# Patient Record
Sex: Female | Born: 1935 | Race: White | Hispanic: No | Marital: Married | Smoking: Former smoker
Health system: Southern US, Community
[De-identification: ages and names within clinical notes are randomized; demographics above are authoritative.]

## PROBLEM LIST (undated history)

## (undated) DIAGNOSIS — I2510 Atherosclerotic heart disease of native coronary artery without angina pectoris: Secondary | ICD-10-CM

## (undated) DIAGNOSIS — M1990 Unspecified osteoarthritis, unspecified site: Secondary | ICD-10-CM

## (undated) DIAGNOSIS — F419 Anxiety disorder, unspecified: Secondary | ICD-10-CM

## (undated) DIAGNOSIS — D649 Anemia, unspecified: Secondary | ICD-10-CM

## (undated) DIAGNOSIS — I10 Essential (primary) hypertension: Secondary | ICD-10-CM

## (undated) DIAGNOSIS — K219 Gastro-esophageal reflux disease without esophagitis: Secondary | ICD-10-CM

## (undated) DIAGNOSIS — F0390 Unspecified dementia without behavioral disturbance: Secondary | ICD-10-CM

---

## 1999-12-22 ENCOUNTER — Encounter: Payer: Self-pay | Admitting: Orthopaedic Surgery

## 1999-12-30 ENCOUNTER — Inpatient Hospital Stay (HOSPITAL_COMMUNITY): Admission: RE | Admit: 1999-12-30 | Discharge: 2000-01-03 | Payer: Self-pay | Admitting: Orthopaedic Surgery

## 2000-01-23 ENCOUNTER — Encounter: Admission: RE | Admit: 2000-01-23 | Discharge: 2000-03-09 | Payer: Self-pay | Admitting: Orthopaedic Surgery

## 2000-02-10 ENCOUNTER — Ambulatory Visit (HOSPITAL_COMMUNITY): Admission: RE | Admit: 2000-02-10 | Discharge: 2000-02-10 | Payer: Self-pay | Admitting: Orthopaedic Surgery

## 2001-05-11 ENCOUNTER — Encounter: Payer: Self-pay | Admitting: Family Medicine

## 2001-05-11 ENCOUNTER — Encounter: Admission: RE | Admit: 2001-05-11 | Discharge: 2001-05-11 | Payer: Self-pay | Admitting: Family Medicine

## 2001-06-21 ENCOUNTER — Encounter: Payer: Self-pay | Admitting: Family Medicine

## 2001-06-21 ENCOUNTER — Encounter: Admission: RE | Admit: 2001-06-21 | Discharge: 2001-06-21 | Payer: Self-pay | Admitting: Family Medicine

## 2002-07-13 ENCOUNTER — Encounter: Payer: Self-pay | Admitting: Family Medicine

## 2002-07-13 ENCOUNTER — Encounter: Admission: RE | Admit: 2002-07-13 | Discharge: 2002-07-13 | Payer: Self-pay | Admitting: Family Medicine

## 2004-07-15 ENCOUNTER — Encounter: Admission: RE | Admit: 2004-07-15 | Discharge: 2004-07-15 | Payer: Self-pay | Admitting: Family Medicine

## 2005-01-01 ENCOUNTER — Encounter: Admission: RE | Admit: 2005-01-01 | Discharge: 2005-01-14 | Payer: Self-pay | Admitting: Orthopaedic Surgery

## 2005-01-07 ENCOUNTER — Encounter: Admission: RE | Admit: 2005-01-07 | Discharge: 2005-01-07 | Payer: Self-pay | Admitting: Orthopaedic Surgery

## 2005-03-25 ENCOUNTER — Encounter: Admission: RE | Admit: 2005-03-25 | Discharge: 2005-03-25 | Payer: Self-pay | Admitting: Orthopaedic Surgery

## 2005-05-25 ENCOUNTER — Encounter: Admission: RE | Admit: 2005-05-25 | Discharge: 2005-05-25 | Payer: Self-pay | Admitting: Orthopaedic Surgery

## 2005-08-26 ENCOUNTER — Encounter: Admission: RE | Admit: 2005-08-26 | Discharge: 2005-08-26 | Payer: Self-pay | Admitting: Family Medicine

## 2006-07-01 ENCOUNTER — Encounter: Admission: RE | Admit: 2006-07-01 | Discharge: 2006-07-01 | Payer: Self-pay | Admitting: Family Medicine

## 2006-08-15 ENCOUNTER — Emergency Department (HOSPITAL_COMMUNITY): Admission: EM | Admit: 2006-08-15 | Discharge: 2006-08-15 | Payer: Self-pay | Admitting: Emergency Medicine

## 2007-05-17 ENCOUNTER — Emergency Department (HOSPITAL_COMMUNITY): Admission: EM | Admit: 2007-05-17 | Discharge: 2007-05-17 | Payer: Self-pay | Admitting: Emergency Medicine

## 2008-01-10 ENCOUNTER — Emergency Department (HOSPITAL_COMMUNITY): Admission: EM | Admit: 2008-01-10 | Discharge: 2008-01-10 | Payer: Self-pay | Admitting: Emergency Medicine

## 2008-02-20 ENCOUNTER — Ambulatory Visit (HOSPITAL_COMMUNITY): Admission: RE | Admit: 2008-02-20 | Discharge: 2008-02-20 | Payer: Self-pay | Admitting: Orthopaedic Surgery

## 2009-07-08 ENCOUNTER — Inpatient Hospital Stay (HOSPITAL_COMMUNITY): Admission: EM | Admit: 2009-07-08 | Discharge: 2009-07-11 | Payer: Self-pay | Admitting: Emergency Medicine

## 2009-07-09 ENCOUNTER — Encounter (INDEPENDENT_AMBULATORY_CARE_PROVIDER_SITE_OTHER): Payer: Self-pay | Admitting: Internal Medicine

## 2009-07-09 ENCOUNTER — Ambulatory Visit: Payer: Self-pay | Admitting: Vascular Surgery

## 2009-08-09 ENCOUNTER — Emergency Department (HOSPITAL_COMMUNITY): Admission: EM | Admit: 2009-08-09 | Discharge: 2009-08-09 | Payer: Self-pay | Admitting: Emergency Medicine

## 2010-01-07 ENCOUNTER — Observation Stay (HOSPITAL_COMMUNITY): Admission: EM | Admit: 2010-01-07 | Discharge: 2010-01-08 | Payer: Self-pay | Admitting: Emergency Medicine

## 2010-01-16 ENCOUNTER — Emergency Department (HOSPITAL_COMMUNITY): Admission: EM | Admit: 2010-01-16 | Discharge: 2010-01-16 | Payer: Self-pay | Admitting: Emergency Medicine

## 2010-04-03 ENCOUNTER — Emergency Department (HOSPITAL_COMMUNITY): Admission: EM | Admit: 2010-04-03 | Discharge: 2010-04-04 | Payer: Self-pay | Admitting: Emergency Medicine

## 2010-04-26 ENCOUNTER — Emergency Department (HOSPITAL_COMMUNITY): Admission: EM | Admit: 2010-04-26 | Discharge: 2010-04-27 | Payer: Self-pay | Admitting: Emergency Medicine

## 2010-07-13 ENCOUNTER — Encounter: Payer: Self-pay | Admitting: Orthopaedic Surgery

## 2010-07-14 ENCOUNTER — Encounter: Payer: Self-pay | Admitting: Orthopaedic Surgery

## 2010-07-22 ENCOUNTER — Observation Stay (HOSPITAL_COMMUNITY)
Admission: EM | Admit: 2010-07-22 | Discharge: 2010-07-24 | Disposition: A | Discharge status: Home / Self Care | Payer: Medicare Other | Attending: Internal Medicine | Admitting: Internal Medicine

## 2010-07-22 DIAGNOSIS — G9341 Metabolic encephalopathy: Secondary | ICD-10-CM | POA: Insufficient documentation

## 2010-07-22 DIAGNOSIS — M1990 Unspecified osteoarthritis, unspecified site: Secondary | ICD-10-CM | POA: Insufficient documentation

## 2010-07-22 DIAGNOSIS — F0280 Dementia in other diseases classified elsewhere without behavioral disturbance: Secondary | ICD-10-CM | POA: Insufficient documentation

## 2010-07-22 DIAGNOSIS — I10 Essential (primary) hypertension: Secondary | ICD-10-CM | POA: Insufficient documentation

## 2010-07-22 DIAGNOSIS — Z7982 Long term (current) use of aspirin: Secondary | ICD-10-CM | POA: Insufficient documentation

## 2010-07-22 DIAGNOSIS — K219 Gastro-esophageal reflux disease without esophagitis: Secondary | ICD-10-CM | POA: Insufficient documentation

## 2010-07-22 DIAGNOSIS — A498 Other bacterial infections of unspecified site: Secondary | ICD-10-CM | POA: Insufficient documentation

## 2010-07-22 DIAGNOSIS — E860 Dehydration: Secondary | ICD-10-CM | POA: Insufficient documentation

## 2010-07-22 DIAGNOSIS — N390 Urinary tract infection, site not specified: Principal | ICD-10-CM | POA: Insufficient documentation

## 2010-07-22 DIAGNOSIS — G309 Alzheimer's disease, unspecified: Secondary | ICD-10-CM | POA: Insufficient documentation

## 2010-07-22 DIAGNOSIS — Z96659 Presence of unspecified artificial knee joint: Secondary | ICD-10-CM | POA: Insufficient documentation

## 2010-07-22 DIAGNOSIS — R4182 Altered mental status, unspecified: Secondary | ICD-10-CM | POA: Insufficient documentation

## 2010-07-22 DIAGNOSIS — Z79899 Other long term (current) drug therapy: Secondary | ICD-10-CM | POA: Insufficient documentation

## 2010-07-22 DIAGNOSIS — F411 Generalized anxiety disorder: Secondary | ICD-10-CM | POA: Insufficient documentation

## 2010-07-22 LAB — DIFFERENTIAL
Basophils Absolute: 0 10*3/uL (ref 0.0–0.1)
Lymphocytes Relative: 7 % — ABNORMAL LOW (ref 12–46)
Lymphs Abs: 0.6 10*3/uL — ABNORMAL LOW (ref 0.7–4.0)
Monocytes Absolute: 0.6 10*3/uL (ref 0.1–1.0)
Neutro Abs: 7.4 10*3/uL (ref 1.7–7.7)

## 2010-07-22 LAB — CBC
HCT: 34.4 % — ABNORMAL LOW (ref 36.0–46.0)
Hemoglobin: 11.4 g/dL — ABNORMAL LOW (ref 12.0–15.0)
MCHC: 33.1 g/dL (ref 30.0–36.0)
MCV: 86.9 fL (ref 78.0–100.0)
WBC: 8.7 10*3/uL (ref 4.0–10.5)

## 2010-07-22 LAB — URINALYSIS, ROUTINE W REFLEX MICROSCOPIC
Bilirubin Urine: NEGATIVE
Hgb urine dipstick: NEGATIVE
Specific Gravity, Urine: 1.015 (ref 1.005–1.030)
Urine Glucose, Fasting: NEGATIVE mg/dL
Urobilinogen, UA: 0.2 mg/dL (ref 0.0–1.0)
pH: 6.5 (ref 5.0–8.0)

## 2010-07-22 LAB — COMPREHENSIVE METABOLIC PANEL
AST: 19 U/L (ref 0–37)
CO2: 28 mEq/L (ref 19–32)
Calcium: 9.6 mg/dL (ref 8.4–10.5)
Creatinine, Ser: 0.92 mg/dL (ref 0.4–1.2)
GFR calc Af Amer: >60 mL/min (ref >60)
GFR calc non Af Amer: 58 mL/min — ABNORMAL LOW (ref >60)
Sodium: 131 mEq/L — ABNORMAL LOW (ref 135–145)
Total Protein: 7.8 g/dL (ref 6.0–8.3)

## 2010-07-22 LAB — POCT CARDIAC MARKERS
CKMB, poc: 1.1 ng/mL (ref 1.0–8.0)
Myoglobin, poc: 128 ng/mL (ref 12–200)
Troponin i, poc: <0.05 ng/mL (ref 0.00–0.09)

## 2010-07-22 LAB — URINE MICROSCOPIC-ADD ON

## 2010-07-23 LAB — CBC
Hemoglobin: 10.5 g/dL — ABNORMAL LOW (ref 12.0–15.0)
MCH: 28.7 pg (ref 26.0–34.0)
MCV: 87.2 fL (ref 78.0–100.0)
RBC: 3.66 MIL/uL — ABNORMAL LOW (ref 3.87–5.11)

## 2010-07-23 LAB — BASIC METABOLIC PANEL
CO2: 29 mEq/L (ref 19–32)
Glucose, Bld: 87 mg/dL (ref 70–99)
Potassium: 4.1 mEq/L (ref 3.5–5.1)
Sodium: 136 mEq/L (ref 135–145)

## 2010-07-24 LAB — BASIC METABOLIC PANEL
CO2: 28 mEq/L (ref 19–32)
Calcium: 9.3 mg/dL (ref 8.4–10.5)
Creatinine, Ser: 0.88 mg/dL (ref 0.4–1.2)
GFR calc Af Amer: >60 mL/min (ref >60)
GFR calc non Af Amer: >60 mL/min (ref >60)

## 2010-07-24 LAB — URINE CULTURE: Colony Count: >=100000

## 2010-07-25 NOTE — H&P (Signed)
  NAME:  Tracy Bowers, Tracy Bowers NO.:  [id number]  MEDICAL RECORD NO.:  [id number]          PATIENT TYPE:  OBV  LOCATION:  1306                         FACILITY:  [facility]  PHYSICIAN:  Vania Rea, M.D. DATE OF BIRTH:  1925/05/31  DATE OF ADMISSION:  07/22/2010 DATE OF DISCHARGE:                             HISTORY & PHYSICAL   PRIMARY CARE PHYSICIAN:  Dr. Florentina Jenny and Dr. Velna Hatchet.  CHIEF COMPLAINT:  Altered mental status since this morning.  DICTATION ENDED HERE.     Vania Rea, M.D.     LC/MEDQ  D:  07/22/2010  T:  07/22/2010  Job:  [id number]  Electronically Signed by Vania Rea M.D. on 07/25/2010 11:14:58 PM

## 2010-07-25 NOTE — H&P (Signed)
NAME:  MELAH, EBLING NO.:  [id number]  MEDICAL RECORD NO.:  [id number]          PATIENT TYPE:  OBV  LOCATION:  0103                         FACILITY:  [facility]  PHYSICIAN:  Vania Rea, M.D. DATE OF BIRTH:  1925-04-13  DATE OF ADMISSION:  07/22/2010 DATE OF DISCHARGE:                             HISTORY & PHYSICAL   PRIMARY CARE PHYSICIAN: 1. Dr. Florentina Jenny. 2. Dr. Melba Coon.  CHIEF COMPLAINT:  Altered mental status since this morning.  HISTORY OF PRESENT ILLNESS:  This is an 77 year old Caucasian lady with a history of dementia, a resident of [facility] on the dementia unit, who is usually ambulatory and alert and has a very good appetite, who had an unwitnessed fall last night, was examined by the staff.  There was no evidence of laceration or bruises. She seemed to be her normal self and went to bed normally.  Patient, however, woke this morning more lethargic and more confused, not quite herself, not eating, which is a significant change from her baseline. Her family was contacted and she was brought to the emergency room for further evaluation.  There is no history of headache, nausea or vomiting.  There is no history of fever, cough or cold.  There is no history of increased frequency of urination or dysuria.  There is no history of passage of bloody or black stool.  PAST MEDICAL HISTORY: 1. Hypertension. 2. Alzheimer's disease. 3. Anxiety. 4. GERD. 5. Osteoarthritis. 6. She is status post left knee replacement.  MEDICATIONS: 1. Nexium 40 mg daily, to be started on omeprazole 20 mg daily once     Nexium is exhausted. 2. Xanax 0.5 mg 1/2 tablet three times daily. 3. Namenda 5 mg twice daily. 4. Exelon 6 mg daily. 5. Lisinopril HCTZ 20/25 one tablet Tuesdays, Thursdays, Saturdays. 6. Ambien 5 mg at bedtime when necessary. 7. Divalproex 125 mg twice daily. 8. Meloxicam 15 mg daily. 9. Vicodin 10 mg  daily. 10.Iron sulfate 1 tablet daily. 11.Aspirin 81 mg daily.  ALLERGIES:  No known drug allergies.  Patient is listed as being unable to tolerate 325 mg of ASPIRIN, but she takes the 81 mg without untoward effect. 1.  SOCIAL HISTORY:  She has not used tobacco or alcohol for over 30 years. No history of illicit drug use.  She is a resident of an assisted living facility as noted above.  She is still married.  Her husband is her health care power of attorney and he lives separately at home without family.  FAMILY HISTORY:  Significant for dementia in her mother and sister. There is no family history of cancers or mental illness.  REVIEW OF SYSTEMS:  Other than noted above, unremarkable.  PHYSICAL EXAMINATION:  GENERAL:  Pleasant elderly Caucasian lady, now reportedly much more alert than when she was first brought to the emergency room.  She has received about 200 mL of IV fluids. VITAL SIGNS:  Temperature is 97.9, pulse 73, respiration 18, blood pressure 160/85.  She is saturating 98% on 2 liters. HEENT:  Her pupils are round and equal.  Mucous membranes are pink. Anicteric.  No  cervical lymphadenopathy or thyromegaly.  No carotid bruits. CHEST:  Clear to auscultation bilaterally. CARDIOVASCULAR SYSTEM:  Regular rhythm.  She has a 2/6 systolic murmur. ABDOMEN:  Scaphoid, soft, nontender.  No masses. EXTREMITIES:  She has deformity of the left knee.  She is wearing a soft cast.  There is a small joint effusion.  The knee is not warm nor red. Has a trace edema of both lower extremities.  Dorsalis pedis 1+ bilaterally.  The toes are warm. CENTRAL NERVOUS SYSTEM:  Cranial nerves II-XII are grossly intact.  She had no focal lateralizing signs.  LABORATORY DATA:  Her white count is 8.7, hemoglobin 11.4, MCV 86.9, platelets 210.  She has 85% neutrophils, but her differential is otherwise unremarkable.  Her lipase is normal.  A complete metabolic panel shows a sodium low at 131,  potassium 4.2, chloride 95, CO2 of 28, glucose slightly elevated belong to 115, her BUN is 21, creatinine 0.92. Her calcium is 9.6, albumin is 3.8 and liver functions are otherwise unremarkable.  Her cardiac enzymes are completely normal with myoglobin of 128 and undetectable troponins.  Urinalysis shows specific gravity of 1.015, protein negative, nitrites positive, leukocyte esterase negative and urine microscopy shows many bacteria.  DIAGNOSTIC STUDIES:  Chest x-ray shows no acute cardiopulmonary abnormality and a CT scan of the brain shows no evidence of bleeding and no evidence of any other acute findings and is similar to the CT scan done in November 2011.  ASSESSMENT: 1. Altered mental status probably due to metabolic encephalopathy     related to urinary tract infection and dehydration. 2. Urinary tract infection. 3. Dehydration as evidenced by clinical exam and her BUN creatinine     ratio. 4. Hypertension. 5. Dementia without delirium. 6. Osteoarthritis. 7. Gastroesophageal reflux disease. 8. Anxiety.  PLAN: 1. We will bring this lady in for hydration.  We will get     an evaluation by physical therapy and occupational therapy.     Culture urine and empirically start Rocephin.  If patient is back     to her baseline by tomorrow, she can likely be discharged back to     the nursing facility for continued care, otherwise if patient does     not show improvement back to her baseline, consideration may be     given to transitioning to a skilled nursing facility. 2. Her code status was discussed with her husband and daughter-in-law     and they have confirmed that she is a limited code.  They do want     CPR, but wants no intubation. 3. Other plans as per orders.     Vania Rea, M.D.     LC/MEDQ  D:  07/22/2010  T:  07/22/2010  Job:  [id number]  cc:   Melba Coon, M.D.  Florentina Jenny, M.D.  Electronically Signed by Vania Rea M.D. on 07/25/2010  11:13:41 PM

## 2010-08-06 ENCOUNTER — Emergency Department (HOSPITAL_COMMUNITY)
Admission: EM | Admit: 2010-08-06 | Discharge: 2010-08-06 | Disposition: A | Discharge status: Home / Self Care | Payer: Medicare Other | Attending: Emergency Medicine | Admitting: Emergency Medicine

## 2010-08-06 ENCOUNTER — Encounter (HOSPITAL_COMMUNITY): Payer: Self-pay

## 2010-08-06 ENCOUNTER — Emergency Department (HOSPITAL_COMMUNITY): Payer: Medicare Other

## 2010-08-06 DIAGNOSIS — S0003XA Contusion of scalp, initial encounter: Secondary | ICD-10-CM | POA: Insufficient documentation

## 2010-08-06 DIAGNOSIS — F411 Generalized anxiety disorder: Secondary | ICD-10-CM | POA: Insufficient documentation

## 2010-08-06 DIAGNOSIS — I10 Essential (primary) hypertension: Secondary | ICD-10-CM | POA: Insufficient documentation

## 2010-08-06 DIAGNOSIS — Y9210 Unspecified residential institution as the place of occurrence of the external cause: Secondary | ICD-10-CM | POA: Insufficient documentation

## 2010-08-06 DIAGNOSIS — F068 Other specified mental disorders due to known physiological condition: Secondary | ICD-10-CM | POA: Insufficient documentation

## 2010-08-06 DIAGNOSIS — W19XXXA Unspecified fall, initial encounter: Secondary | ICD-10-CM | POA: Insufficient documentation

## 2010-08-06 DIAGNOSIS — S1093XA Contusion of unspecified part of neck, initial encounter: Secondary | ICD-10-CM | POA: Insufficient documentation

## 2010-08-06 DIAGNOSIS — R51 Headache: Secondary | ICD-10-CM | POA: Insufficient documentation

## 2010-08-06 NOTE — Discharge Summary (Signed)
NAME:  Tracy Bowers, STAPLES NO.:  [id number]  MEDICAL RECORD NO.:  [id number]          PATIENT TYPE:  OBV  LOCATION:  1306                         FACILITY:  [facility]  PHYSICIAN:  Zannie Cove, MD     DATE OF BIRTH:  [birth year]  DATE OF ADMISSION:  07/22/2010 DATE OF DISCHARGE:                         DISCHARGE SUMMARY-REFERRING   PRIMARY CARE PHYSICIAN: 1. Dr. Florentina Jenny. 2. Lupe Carney, MD  DISCHARGE DIAGNOSES: 1. E. coli urinary tract infection. 2. Metabolic encephalopathy/altered mental status secondary to urinary     tract infection, dehydration, and dementia, improved. 3. Alzheimer's dementia. 4. Hypertension. 5. Anxiety disorder. 6. Gastroesophageal reflux disease. 7. Osteoarthritis. 8. History of left total knee arthroplasty.  DISCHARGE MEDICATIONS: 1. Amlodipine 5 mg p.o. daily. 2. Ciprofloxacin 250 mg p.o. b.i.d. for 3 days. 3. Senokot 2 tablets p.o. daily p.r.n. for constipation. 4. Alprazolam 0.5 mg tablet, half a tablet p.o. 3 times a day. 5. Aspirin 81 mg daily. 6. Divalproex 125 mg p.o. b.i.d. 7. Ferrous sulfate 325 mg p.o. daily. 8. Hydrocolloid dressing, 1 application topically weekly as needed. 9. Hydrocortisone topical cream 1% b.i.d. as needed. 10.Lisinopril/hydrochlorothiazide 20/25, 1 tablet q. Tuesday,     Thursday, and Saturday. 11.Loratadine 10 mg p.o. daily. 12.Meloxicam 15 mg p.o. daily. 13.Namenda 5 mg p.o. b.i.d. 14.Neosporin topical, 1 application p.o. b.i.d. p.r.n. till wound     heals. 15.Omeprazole 20 mg p.o. daily. 16.Promethazine 25 mg p.o. q.4 h. p.r.n. for nausea, vomiting. 17.__________ 6 mg capsule daily. 18.Triamcinolone topical ointment 0.5% b.i.d. p.r.n. for rash. 19.Zolpidem 5 mg p.o. at bedtime.  DIAGNOSTIC INVESTIGATIONS: 1. Chest x-ray, January 31, no acute cardiopulmonary abnormalities,     stable biapical scarring. 2. CT of the head January 31, atrophy, small vessel disease.  No acute  intracranial findings.  HOSPITAL COURSE:  Tracy Bowers is an 77 year old female with history of Alzheimer's dementia and is at an assisted living facility presented to the hospital with increasing lethargy, increasing confusion to the emergency room.  On evaluation, she was found to be dehydrated as well as have urinary infection. 1. For the UTI, she was initially started on IV ceftriaxone with good     clinical response.  Urine cultures grew E. coli.  Final sensitivity     is currently still pending, is being discharged home back to the     facility on ciprofloxacin 250 mg p.o. b.i.d. for 3 more days for a     5-day course.  If her antibiotic sensitivities are different, I     will call the nursing home to change this 2. Dehydration.  This was evidenced by clinical exam as well as BUN     and creatinine ratio.  She was hydrated for close to 48 hours     gently.  She had good clinical response and overall skin turgor,     alertness, etc. 3. Dementia.  She did have an episode of delirium last night in the     hospital, most likely felt to be caused by sundowning.  This     responded to her usual p.r.n. doses of Ativan and subsequently has  remained stable mental status wise. 4. Rest of medical problems remained stable.  Discharge condition is     stable.  Vital Signs: Temperature is 98.4, blood pressure 160/62,     respirations 20, satting 97% on room air.  DISCHARGE LABS:  BMET:  Sodium 139, potassium 3.9, chloride 100, bicarb 28, BUN 19, creatinine 0.8, glucose 94.  DISCHARGE FOLLOWUP:  Dr. Florentina Jenny within 1 week of discharge.  I will defer to her primary physician the need to continue meloxicam.  I have not discontinued this since the patient has been maintained on meloxicam for a long time for osteoarthritis.  However, if her BUN and creatinine start to rise, this will need to be discontinued.     Zannie Cove, MD     PJ/MEDQ  D:  07/24/2010  T:  07/24/2010  Job:   [id number]  cc:   Florentina Jenny  L. Lupe Carney, M.D. Fax: [phone]  Electronically Signed by Zannie Cove  on 08/06/2010 02:12:32 PM

## 2010-09-02 LAB — URINALYSIS, ROUTINE W REFLEX MICROSCOPIC
Hgb urine dipstick: NEGATIVE
Nitrite: NEGATIVE
Specific Gravity, Urine: 1.011 (ref 1.005–1.030)
Urobilinogen, UA: 0.2 mg/dL (ref 0.0–1.0)

## 2010-09-02 LAB — DIFFERENTIAL
Basophils Absolute: 0 10*3/uL (ref 0.0–0.1)
Basophils Relative: 1 % (ref 0–1)
Monocytes Relative: 13 % — ABNORMAL HIGH (ref 3–12)
Neutro Abs: 2.6 10*3/uL (ref 1.7–7.7)
Neutrophils Relative %: 55 % (ref 43–77)

## 2010-09-02 LAB — CBC
Hemoglobin: 10.2 g/dL — ABNORMAL LOW (ref 12.0–15.0)
MCHC: 32.7 g/dL (ref 30.0–36.0)

## 2010-09-02 LAB — BASIC METABOLIC PANEL
Calcium: 8.9 mg/dL (ref 8.4–10.5)
GFR calc Af Amer: >60 mL/min (ref >60)
GFR calc non Af Amer: 52 mL/min — ABNORMAL LOW (ref >60)
Glucose, Bld: 136 mg/dL — ABNORMAL HIGH (ref 70–99)
Sodium: 134 mEq/L — ABNORMAL LOW (ref 135–145)

## 2010-09-06 LAB — DIFFERENTIAL
Basophils Absolute: 0 10*3/uL (ref 0.0–0.1)
Basophils Absolute: 0 10*3/uL (ref 0.0–0.1)
Basophils Relative: 1 % (ref 0–1)
Eosinophils Absolute: 0.6 10*3/uL (ref 0.0–0.7)
Eosinophils Relative: 2 % (ref 0–5)
Eosinophils Relative: 9 % — ABNORMAL HIGH (ref 0–5)
Lymphocytes Relative: 13 % (ref 12–46)
Monocytes Absolute: 0.5 10*3/uL (ref 0.1–1.0)
Monocytes Relative: 8 % (ref 3–12)

## 2010-09-06 LAB — POCT I-STAT, CHEM 8
Calcium, Ion: 0.93 mmol/L — ABNORMAL LOW (ref 1.12–1.32)
Glucose, Bld: 86 mg/dL (ref 70–99)
HCT: 34 % — ABNORMAL LOW (ref 36.0–46.0)
Hemoglobin: 11.6 g/dL — ABNORMAL LOW (ref 12.0–15.0)

## 2010-09-06 LAB — BASIC METABOLIC PANEL
CO2: 28 mEq/L (ref 19–32)
Calcium: 9.3 mg/dL (ref 8.4–10.5)
Creatinine, Ser: 1.03 mg/dL (ref 0.4–1.2)
Glucose, Bld: 88 mg/dL (ref 70–99)
Sodium: 133 mEq/L — ABNORMAL LOW (ref 135–145)

## 2010-09-06 LAB — TROPONIN I: Troponin I: 0.03 ng/mL (ref 0.00–0.06)

## 2010-09-06 LAB — CBC
MCH: 30 pg (ref 26.0–34.0)
MCV: 88.5 fL (ref 78.0–100.0)
Platelets: 273 10*3/uL (ref 150–400)
Platelets: 253 10*3/uL (ref 150–400)
RBC: 3.75 MIL/uL — ABNORMAL LOW (ref 3.87–5.11)
RDW: 12.9 % (ref 11.5–15.5)
WBC: 6.2 10*3/uL (ref 4.0–10.5)
WBC: 6.4 10*3/uL (ref 4.0–10.5)

## 2010-09-06 LAB — CK TOTAL AND CKMB (NOT AT ARMC): CK, MB: 1.7 ng/mL (ref 0.3–4.0)

## 2010-09-06 LAB — COMPREHENSIVE METABOLIC PANEL
AST: 20 U/L (ref 0–37)
Albumin: 3.9 g/dL (ref 3.5–5.2)
Alkaline Phosphatase: 89 U/L (ref 39–117)
Chloride: 96 mEq/L (ref 96–112)
GFR calc Af Amer: >60 mL/min (ref >60)
Potassium: 4.6 mEq/L (ref 3.5–5.1)
Sodium: 130 mEq/L — ABNORMAL LOW (ref 135–145)
Total Bilirubin: 0.8 mg/dL (ref 0.3–1.2)

## 2010-09-06 LAB — PROTIME-INR: Prothrombin Time: 11.9 seconds (ref 11.6–15.2)

## 2010-09-06 LAB — POCT CARDIAC MARKERS
CKMB, poc: 1.1 ng/mL (ref 1.0–8.0)
CKMB, poc: 1.4 ng/mL (ref 1.0–8.0)
Myoglobin, poc: 77.5 ng/mL (ref 12–200)

## 2010-09-06 LAB — CARDIAC PANEL(CRET KIN+CKTOT+MB+TROPI)
Relative Index: INVALID (ref 0.0–2.5)
Total CK: 52 U/L (ref 7–177)
Troponin I: 0.01 ng/mL (ref 0.00–0.06)

## 2010-09-08 LAB — LIPID PANEL
Cholesterol: 186 mg/dL (ref 0–200)
HDL: 70 mg/dL (ref >39)
LDL Cholesterol: 108 mg/dL — ABNORMAL HIGH (ref 0–99)
Total CHOL/HDL Ratio: 2.7 ratio
Triglycerides: 40 mg/dL (ref <150)
VLDL: 8 mg/dL (ref 0–40)

## 2010-09-08 LAB — URINE CULTURE: Colony Count: >=100000

## 2010-09-08 LAB — CBC
HCT: 29.2 % — ABNORMAL LOW (ref 36.0–46.0)
HCT: 30.7 % — ABNORMAL LOW (ref 36.0–46.0)
HCT: 36 % (ref 36.0–46.0)
Hemoglobin: 10 g/dL — ABNORMAL LOW (ref 12.0–15.0)
Hemoglobin: 10.5 g/dL — ABNORMAL LOW (ref 12.0–15.0)
Hemoglobin: 12.3 g/dL (ref 12.0–15.0)
MCHC: 34.3 g/dL (ref 30.0–36.0)
MCV: 88.1 fL (ref 78.0–100.0)
MCV: 88.7 fL (ref 78.0–100.0)
Platelets: 220 10*3/uL (ref 150–400)
Platelets: 239 10*3/uL (ref 150–400)
RBC: 4.03 MIL/uL (ref 3.87–5.11)
RDW: 13.4 % (ref 11.5–15.5)
WBC: 6 10*3/uL (ref 4.0–10.5)
WBC: 6.8 10*3/uL (ref 4.0–10.5)
WBC: 6.9 10*3/uL (ref 4.0–10.5)

## 2010-09-08 LAB — HEPATIC FUNCTION PANEL
AST: 16 U/L (ref 0–37)
Bilirubin, Direct: 0.2 mg/dL (ref 0.0–0.3)
Indirect Bilirubin: 0.7 mg/dL (ref 0.3–0.9)

## 2010-09-08 LAB — BASIC METABOLIC PANEL
BUN: 16 mg/dL (ref 6–23)
BUN: 16 mg/dL (ref 6–23)
Calcium: 8.7 mg/dL (ref 8.4–10.5)
Chloride: 98 mEq/L (ref 96–112)
Chloride: 98 mEq/L (ref 96–112)
Creatinine, Ser: 0.84 mg/dL (ref 0.4–1.2)
Creatinine, Ser: 0.99 mg/dL (ref 0.4–1.2)
GFR calc Af Amer: >60 mL/min (ref >60)
GFR calc non Af Amer: 50 mL/min — ABNORMAL LOW (ref >60)
GFR calc non Af Amer: 54 mL/min — ABNORMAL LOW (ref >60)
GFR calc non Af Amer: 59 mL/min — ABNORMAL LOW (ref >60)
Glucose, Bld: 86 mg/dL (ref 70–99)
Glucose, Bld: 99 mg/dL (ref 70–99)
Potassium: 3.9 mEq/L (ref 3.5–5.1)
Potassium: 3.9 mEq/L (ref 3.5–5.1)
Potassium: 4 mEq/L (ref 3.5–5.1)
Sodium: 130 mEq/L — ABNORMAL LOW (ref 135–145)
Sodium: 131 mEq/L — ABNORMAL LOW (ref 135–145)

## 2010-09-08 LAB — CK TOTAL AND CKMB (NOT AT ARMC)
CK, MB: 1.8 ng/mL (ref 0.3–4.0)
CK, MB: 2 ng/mL (ref 0.3–4.0)
Relative Index: INVALID (ref 0.0–2.5)
Total CK: 78 U/L (ref 7–177)

## 2010-09-08 LAB — TROPONIN I
Troponin I: 0.01 ng/mL (ref 0.00–0.06)
Troponin I: 0.01 ng/mL (ref 0.00–0.06)

## 2010-09-08 LAB — CARDIAC PANEL(CRET KIN+CKTOT+MB+TROPI)
CK, MB: 2 ng/mL (ref 0.3–4.0)
CK, MB: 2.1 ng/mL (ref 0.3–4.0)
Relative Index: INVALID (ref 0.0–2.5)
Troponin I: 0.01 ng/mL (ref 0.00–0.06)
Troponin I: 0.01 ng/mL (ref 0.00–0.06)

## 2010-09-08 LAB — DIFFERENTIAL
Basophils Absolute: 0 10*3/uL (ref 0.0–0.1)
Eosinophils Relative: 1 % (ref 0–5)
Lymphocytes Relative: 7 % — ABNORMAL LOW (ref 12–46)
Lymphs Abs: 0.6 10*3/uL — ABNORMAL LOW (ref 0.7–4.0)
Neutro Abs: 7.6 10*3/uL (ref 1.7–7.7)
Neutrophils Relative %: 86 % — ABNORMAL HIGH (ref 43–77)

## 2010-09-08 LAB — URINALYSIS, ROUTINE W REFLEX MICROSCOPIC
Nitrite: POSITIVE — AB
Specific Gravity, Urine: 1.012 (ref 1.005–1.030)
Urobilinogen, UA: 0.2 mg/dL (ref 0.0–1.0)
pH: 7 (ref 5.0–8.0)

## 2010-09-08 LAB — URINE MICROSCOPIC-ADD ON

## 2010-09-08 LAB — TSH: TSH: 2.669 u[IU]/mL (ref 0.350–4.500)

## 2010-09-08 LAB — D-DIMER, QUANTITATIVE: D-Dimer, Quant: 3.37 ug/mL-FEU — ABNORMAL HIGH (ref 0.00–0.48)

## 2010-11-07 NOTE — Op Note (Signed)
[place]. [facility]  Patient:    NOT GIVEN                             MRN: [id number] Attending:  Claude Manges. Cleophas Dunker, M.D.                           Operative Report  NO DICTATION. DD:  12/30/99 TD:  12/30/99 Job: [id number]

## 2010-11-07 NOTE — Op Note (Signed)
[place]. [facility]  Patient:    Tracy Bowers, Tracy Bowers                 MRN: [id number] Proc. Date: 12/30/99 Adm. Date:  [id number] Disc. Date: [id number] Attending:  Randolm Idol                           Operative Report  NOTE:  This report is dictated from memory.  PREOPERATIVE DIAGNOSIS:  End-stage osteoarthritis left knee.  POSTOPERATIVE DIAGNOSIS:  End-stage osteoarthritis left knee.  OPERATION:  Left total knee replacement.  SURGEON:  Claude Manges. Cleophas Dunker, M.D.  ASSISTANT:  Jamelle Rushing, P.A.C.  ANESTHESIA:  Spinal anesthesia.  COMPLICATIONS:  None.  COMPONENTS:  Depuy LCS standard femoral, standard plus rotating tibial platform with a 10 degree polyethylene bearing.  Cruciate metal back patella. All was secured with polymethyl methacrylate.  DESCRIPTION OF PROCEDURE:  With the patient comfortable on the operating table and under spinal anesthetic, the left lower extremity was placed in a thigh tourniquet.  The leg was then prepped with Betadine scrub and then DuraPrep from the tourniquet to the ankle. Sterile draping was performed.  With the extremity still elevated, it was Esmarch exanguinated with a proximal tourniquet at 350 mmHg.  Sterile draping.  Midline longitudinal incision was made and via sharp dissection, carried down through the subcutaneous tissue. The first layer of capsule was incised in the midline.  Using the Bovie, a medial parapatellar incision was then made through the deep capsule.  There were approximately 10 to 15 cc of clear yellow joint effusion.  The patella was everted 180 degrees and the knee flexed to 90 degrees. There were large osteophytes on the medial and lateral femoral condyle with considerable loss of articular cartilage in the medial and lateral femoral condyle.  I do not remember the specific pathology.  It has been two months since the surgery.  A TS Depuy system was utilized. The  appropriate jigs were used to make the femoral and tibial cuts.  The 10 mm flexion extension gap was utilized.  Both ACL and PCL were sacrificed. A 4 degree distal valgus cut was made.  ______ and MCL were intact.  The laminar spurs were inserted to remove remnants of soft tissue in the medial and lateral compartments including medial and lateral menisci and remnants of ACL and PCL stumps.  The ________ retractor was inserted behind the tibia.  It was advanced anteriorly so that we could visualize the entire surface.  A single spiked retractor was placed laterally to visualize the entire tibial surface.  We measured a standard plus component preoperatively and this was confirmed intraoperatively.  The standard femoral component was confirmed intraoperatively.  The appropriate cut was made in the tibia. The trial femoral and trial tibial components were then applied followed by a 10 mm bridging bearing.  The knee was placed through full range of motion and did not open with the dorsal valgus stress, had full extension and could flex perhaps 120 degrees or more.  There was no malrotation of the tibial component.  The patella was then prepared by removing enough bone to leave 13 mm of patella with a nice smooth surface.  Synovectomy was performed and extraneous fat pad was also resected.  The cruciate backed jig was then applied to make the appropriate cut in the patella to accept a cruciate and metal backed patella.  After the cuts were  made, the trial patella was applied. The knee was placed through full range of motion without malrotation of the patella.  Trial components were then removed.  The joint was then copiously irrigated with saline and antibiotic solution.  Each of the components  were then secured with polymethyl methacrylate.  A patella clamp was applied to the patella.  After complete maturation, the joint was inspected and extraneous methacrylate was removed with an  osteotome.  The knee was placed through full range of motion without malrotation of the tibial component or the patella.  The joint was again irrigated with saline solution.  There was no evidence of any extraneous methacrylate.  The deep capsule was closed with 0 Ti-Cron.  The superficial capsule closed with a running 0 Vicryl and subcutaneous with 2-0 Vicryl and the skin closed with skin clips.  Sterile bulky dressing was applied followed by an Ace bandage and a knee immobilizer.  The patient tolerated the procedure without complications. DD:  03/01/00 TD:  03/02/00 Job: [id number]

## 2010-11-07 NOTE — Discharge Summary (Signed)
[facility]  Patient:    Tracy Bowers, SWANGO                 MRN: [id number] Adm. Date:  [id number] Disc. Date: [id number] Attending:  Randolm Idol Dictator:   Arnoldo Morale, P.A.-C. CCAbran Cantor. Clovis Riley, MD, phone [phone]                           Discharge Summary  ADMITTING DIAGNOSES: 1. End-stage osteoarthritis left knee. 2. Hypercholesterolemia. 3. Hiatal hernia. 4. History of peptic ulcer disease.  DISCHARGE DIAGNOSES: 1. End-stage osteoarthritis left knee. 2. Hypercholesterolemia. 3. Hiatal hernia. 4. History of peptic ulcer disease. 5. Posthemorrhagic anemia.  SURGICAL PROCEDURE:  On December 30, 1999, Tracy Bowers underwent a left total knee arthroplasty by Dr. Claude Manges. [place].  COMPLICATIONS:  None.  CONSULTS: 1. Medical consult by [facility] hospitalist for [facility] done on    December 30, 1999. 2. Pharmacy consult for Coumadin therapy January 09, 2000. 3. Physical therapy and rehab medicine consult on December 31, 1999. 4. Occupational therapy consult on January 01, 2000.  HISTORY OF PRESENT ILLNESS:  This 77 year old white female presented to Dr. Cleophas Dunker with a three to four year history of progressively worsening sharp knee pain in her left knee.  She was treated conservatively initially and had significant improvement of the pain, but the pain has gradually returned and gotten more severe.  The pain is described as a burning, grinding type of pain which is constant.  She does have sharp, shooting pains with certain movements, and the knee does swell.  The knee pops and grinds with ambulation, and the pain does radiate up to her hip and down to her calf.  P.O. medicines have provided minimal relief, and she is currently using a cane to assist with ambulation.  Because of the severity of her symptoms, she is presenting for a left total knee arthroplasty.  HOSPITAL COURSE:  Tracy Bowers tolerated her surgical  procedure well without any immediate postoperative complications.  She was seen by [facility] hospitalist. She was started on Coumadin per protocol.  On postoperative day one, she was complaining of some nausea with the PCA, and her pain was well controlled with Percocet and Skelaxin.  T-max was 97.8.  Vitals were stable.  Left knee dressing was intact without drainage, and her leg was neurovascularly intact. Her hemoglobin was 10 with a hematocrit of 29.3, and she had received one unit of autologous blood postoperatively.  She was switched from a morphine PCA to Demerol and started on physical therapy per protocol.  Postoperative day two her nausea had resolved with the switch to Demerol.  She was discontinued off the PCA and started on p.o. medications per protocol. T-max was 99.  Vitals were stable.  Left knee incision was well approximated with staples.  Minimal drainage.  Leg was neurovascularly intact.  She did complain of some reflux symptoms and was started on Mylanta for treatment of that and that provided good relief.  Her hemoglobin was 10.1 with hematocrit of 29.2.  She continued to make good progress over the next several days and did not require any further transfusions.  On January 03, 2000, she was stable enough for discharge home.  Her T-max was 98.4.  Vital signs stable.  Left knee incision was unchanged.  She had complained of some decreased sensation in her left foot, but  this was grossly intact and seemed to be diminishing. She was believed ready for discharge home and was discharged home later in the day.  DISCHARGE INSTRUCTIONS: 1. She is to resume all prehospitalization medications and diet with the    exception of the cimetidine. 2. She was given OxyContin 10 mg p.o. q.12h., OxyIR 1-2 p.o. q.4h. p.r.n.    pain.  Coumadin one tablet p.o. q.d. with the dose to be determined by    pharmacy per protocol.  Flexeril 10 mg p.o. q.8.h. p.r.n. spasms, and    Protonix 40 mg p.o.  q day while she was on the Coumadin.  She was also    instructed to take ferrous sulfate 325 mg one tablet p.o. t.i.d. with food. 3. She is to be partial weightbearing 50% or less on the left leg with the use    of the walker. 4. She is to have home health physical therapy and R.N. as previously    arranged. 5. She is to follow-up with Dr. Cleophas Dunker in our office in seven to ten days    and is to call [phone] for that appointment.  She will have staples    removed at that time. 6. She is to notify Dr. Cleophas Dunker of a temperature greater than 101.5 degrees    Fahrenheit, chills, pain unrelieved by medications, or foul smelling    drainage from the wound.  She stated good understanding of these    instructions and was subsequently discharged home.  LABORATORY DATA:  On December 22, 1999, her hemoglobin was 10.8 with hematocrit of 31.6.  On July 11, hemoglobin was 10 with a hematocrit of 29.3.  On July 12, her hemoglobin was 10.1 with a hematocrit of 29.2, and on July 13, her hemoglobin was 9.5 with hematocrit of 28.8.  On July 2, her PT was 12.9 seconds with an INR of 1 and a PTT of 25, and on July 14, her PT was 16.7 seconds with an INR of 1.6.  On December 30, 1999, her iron was 55 mcg/dL.  TIBC was 304.  Percent saturation of 18, B12 of 425, RBC folate of 530, and ferritin of 44.  All other laboratory studies were within normal limits.  Chest x-ray done on December 22, 1999, showed chronic obstructive pulmonary disease with a probable pleural plaque in the right upper lobe, although comparison with previous x-rays would be suggested.  If none are present, then a limited CT was suggested. DD:  02/05/00 TD:  02/06/00 Job: [id number]

## 2011-03-20 LAB — URINALYSIS, ROUTINE W REFLEX MICROSCOPIC
Bilirubin Urine: NEGATIVE
Hgb urine dipstick: NEGATIVE
Specific Gravity, Urine: 1.012
pH: 6

## 2011-03-20 LAB — BASIC METABOLIC PANEL
CO2: 27
GFR calc non Af Amer: 39 — ABNORMAL LOW
Glucose, Bld: 134 — ABNORMAL HIGH
Potassium: 4.2
Sodium: 131 — ABNORMAL LOW

## 2011-03-20 LAB — DIFFERENTIAL
Basophils Absolute: 0
Eosinophils Relative: 0
Lymphocytes Relative: 9 — ABNORMAL LOW
Monocytes Absolute: 0.3

## 2011-03-20 LAB — CBC
HCT: 34 — ABNORMAL LOW
Hemoglobin: 11.5 — ABNORMAL LOW
MCHC: 33.9
RDW: 13.1

## 2011-03-31 LAB — I-STAT 8, (EC8 V) (CONVERTED LAB)
Acid-Base Excess: 1
Bicarbonate: 25.7 — ABNORMAL HIGH
HCT: 35 — ABNORMAL LOW
Operator id: 284251
TCO2: 27
pCO2, Ven: 41 — ABNORMAL LOW

## 2011-03-31 LAB — DIFFERENTIAL
Basophils Absolute: 0
Basophils Relative: 0
Eosinophils Absolute: 0 — ABNORMAL LOW
Eosinophils Relative: 0
Lymphocytes Relative: 13

## 2011-03-31 LAB — CBC
HCT: 31.7 — ABNORMAL LOW
MCHC: 33.7
MCV: 82.3
Platelets: 287
RDW: 14.3

## 2011-05-01 IMAGING — CR DG SHOULDER 2+V*R*
4 series · 4 of 4 positions shown · non-contrast
Comparison: None.

CLINICAL DATA: Fell and injured right shoulder.

RIGHT SHOULDER - 2+ VIEW 04/26/2010:

[t shoulder ap internal righ]
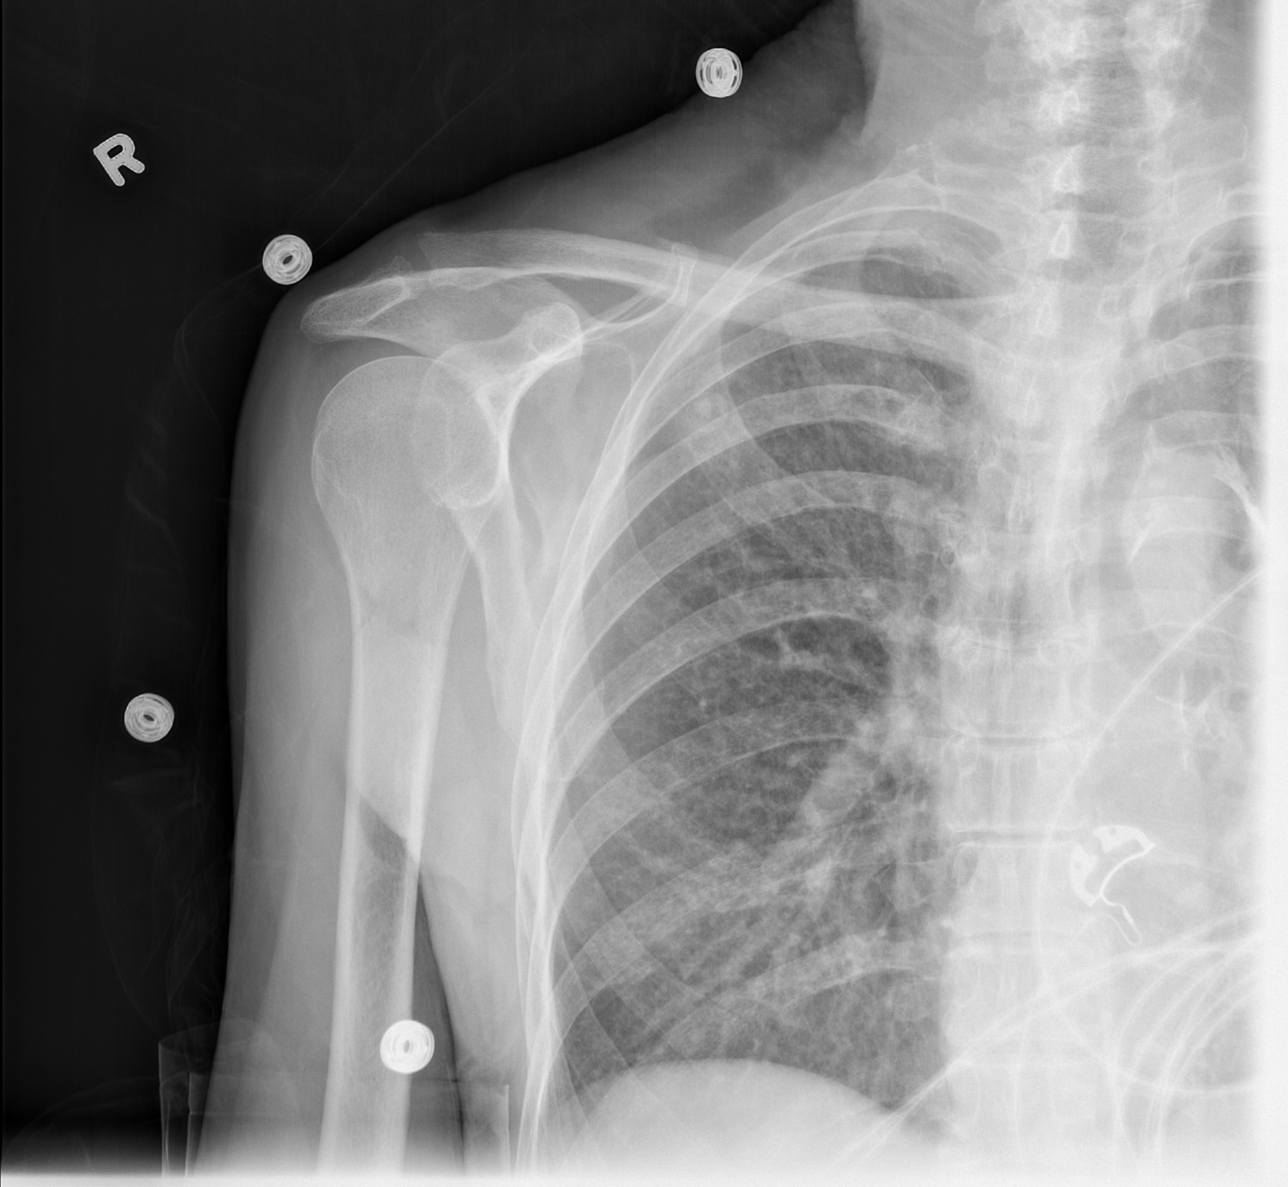

[t shoulder ap external righ]
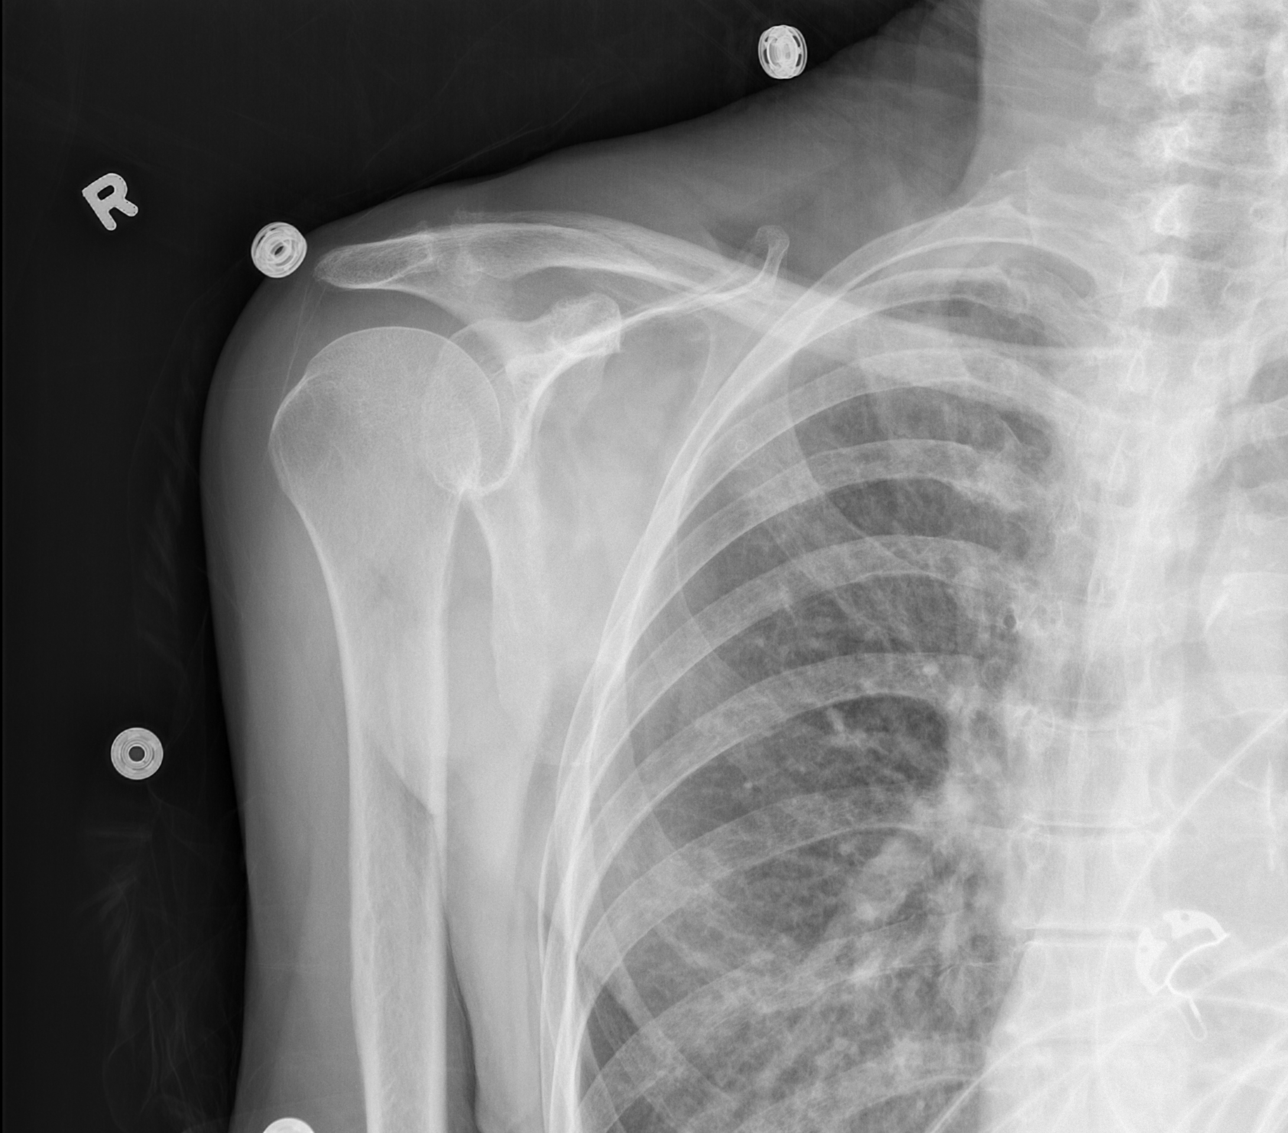

[t shoulder y view right (1 of 2)]
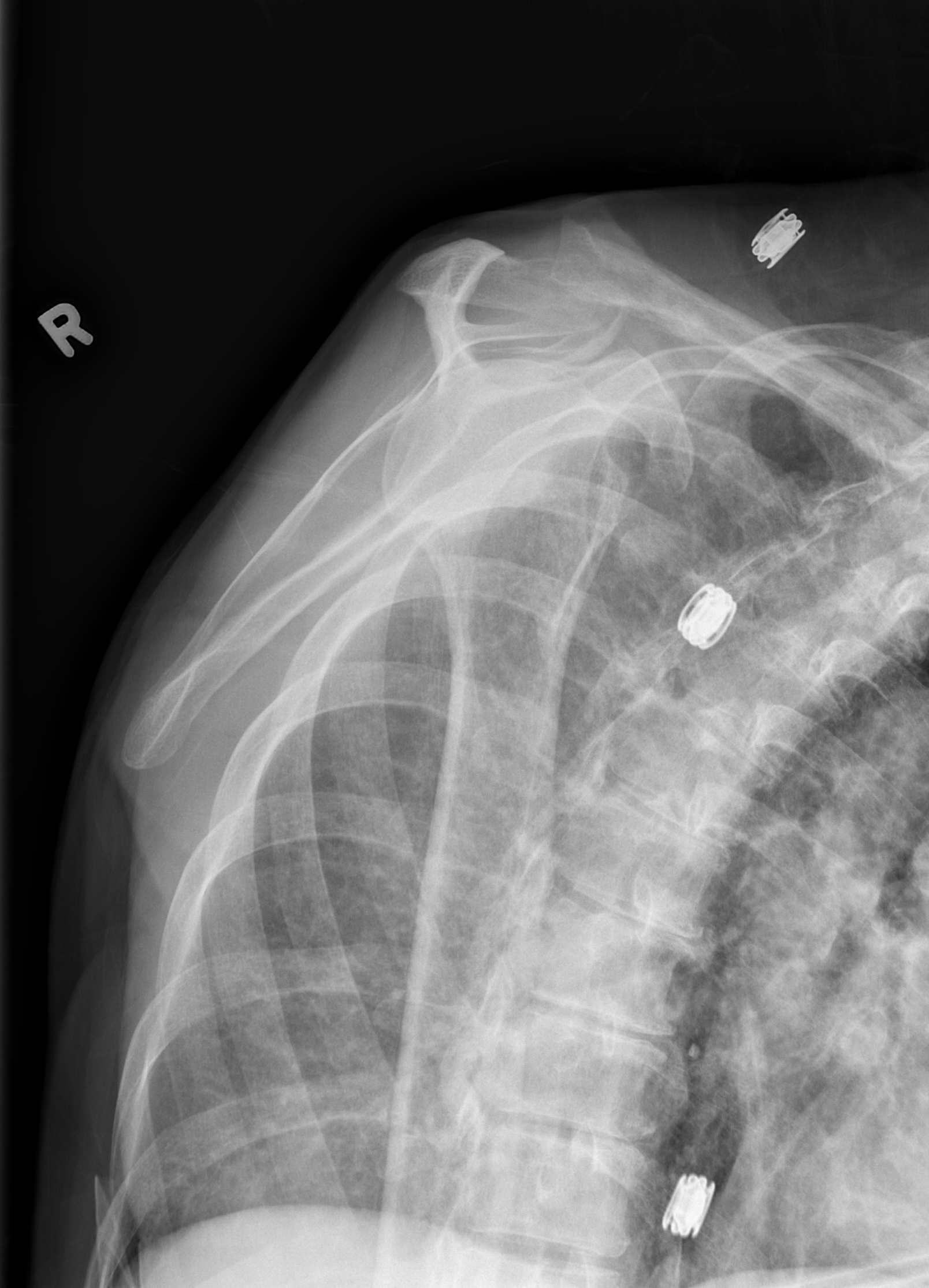

[t shoulder y view right (2 of 2)]
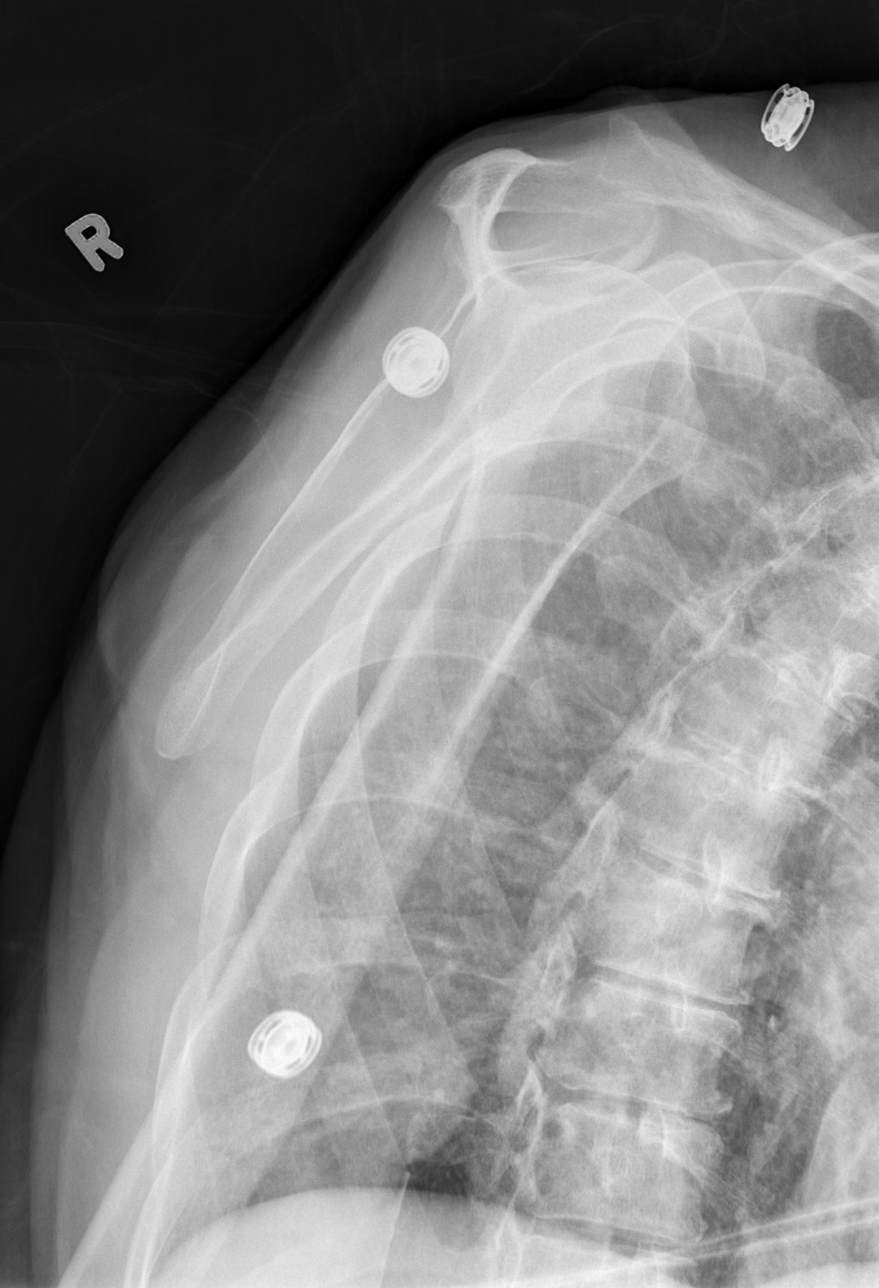

[4 of 4 positions shown; findings below may reference images not displayed]

FINDINGS: No evidence of acute fracture or glenohumeral
dislocation.  Subacromial space well preserved.  Acromioclavicular
joint intact with mild degenerative changes.  Mild generalized
osteopenia.
IMPRESSION: No acute osseous abnormality.  Mild degenerative changes involving
the AC joint.

## 2011-05-01 IMAGING — CT CT HEAD W/O CM
1 of 2 series · 16 of 30 positions shown, 20 images · non-contrast
Comparison: 04/03/2010

CLINICAL DATA: Fall, right frontal head trauma with abrasion and
bruising

CT HEAD WITHOUT CONTRAST
TECHNIQUE: Contiguous axial images were obtained from the base of
the skull through the vertex without contrast.

[Series 3: recon 2: brain · axial · 0.47mm/px · z∈[+132,+264]mm · 16 of 56 slices shown, 20 images]
[im 3/56  brain]
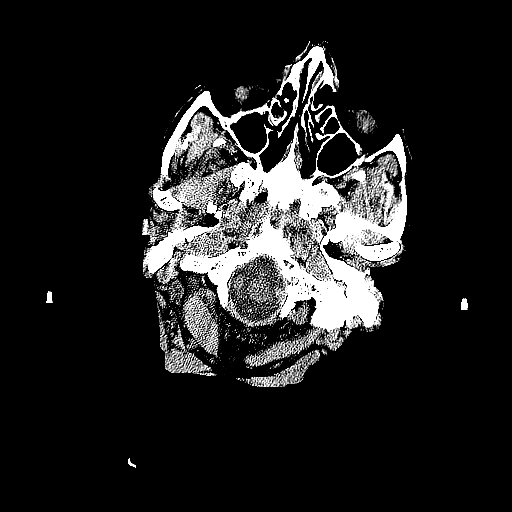
[im 3/56  bone]
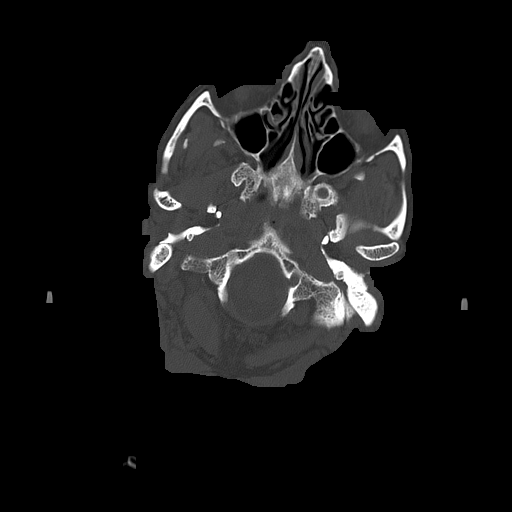
[im 6/56  brain]
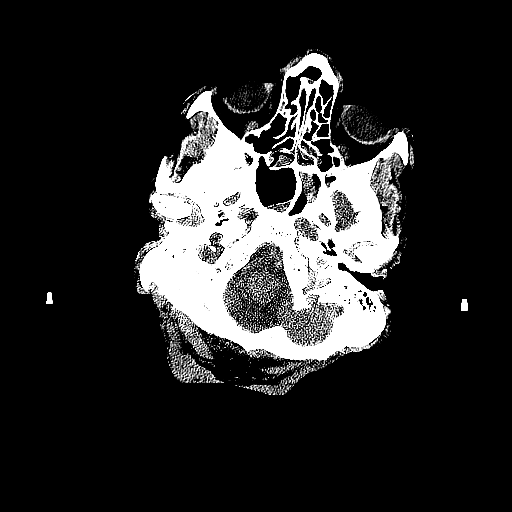
[im 9/56  brain]
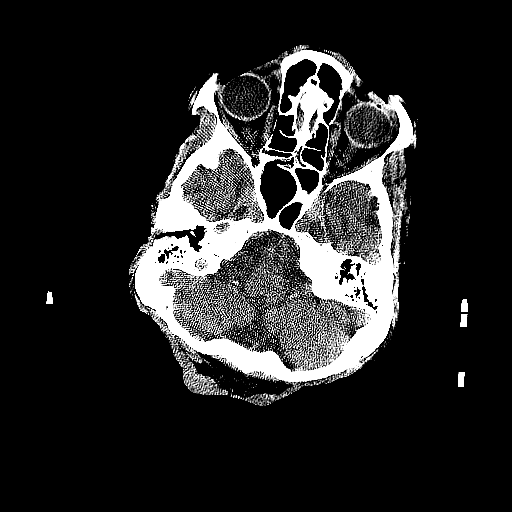
[im 12/56  brain]
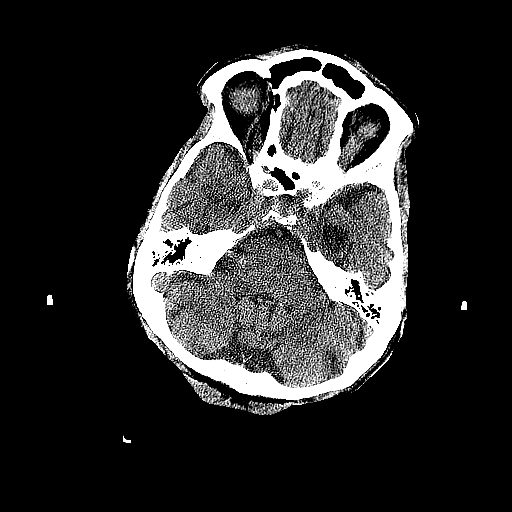
[im 18/56  brain]
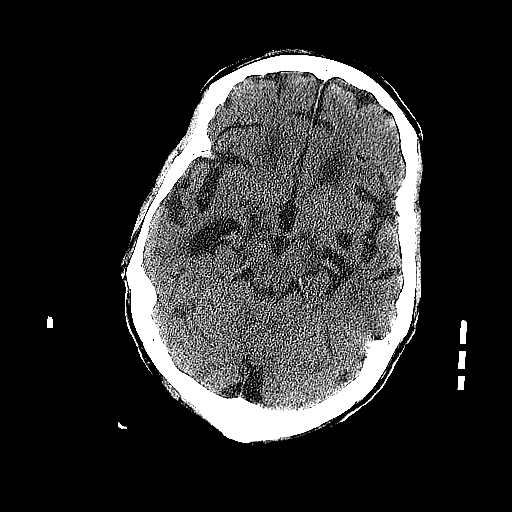
[im 18/56  bone]
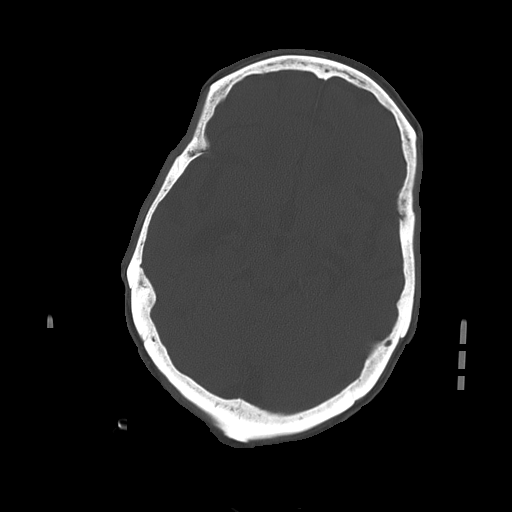
[im 21/56  brain]
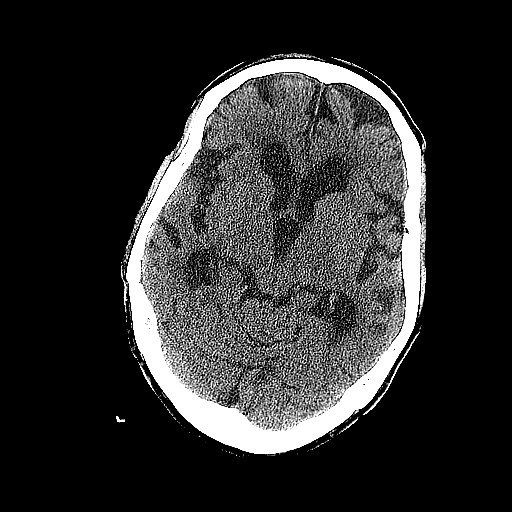
[im 24/56  brain]
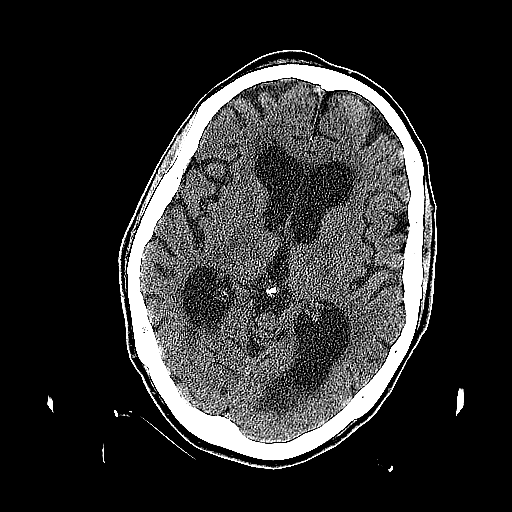
[im 27/56  brain]
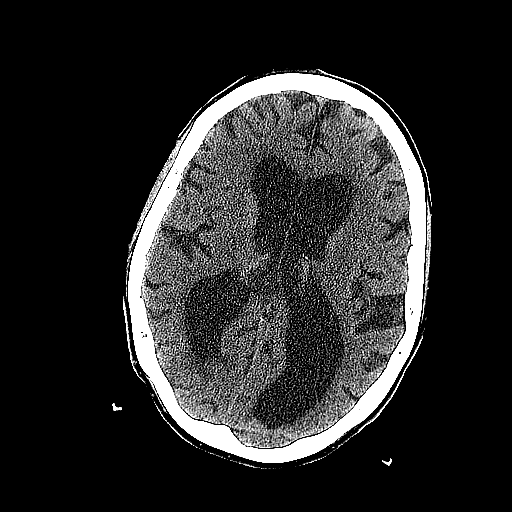
[im 29/56  brain]
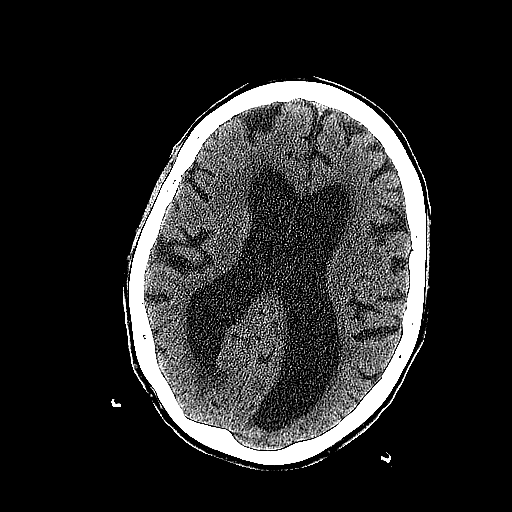
[im 29/56  bone]
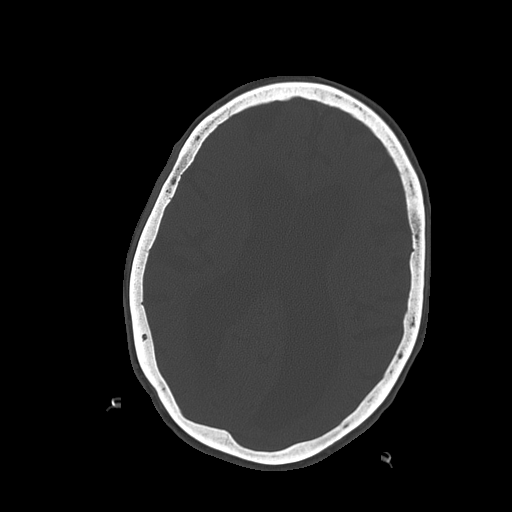
[im 32/56  brain]
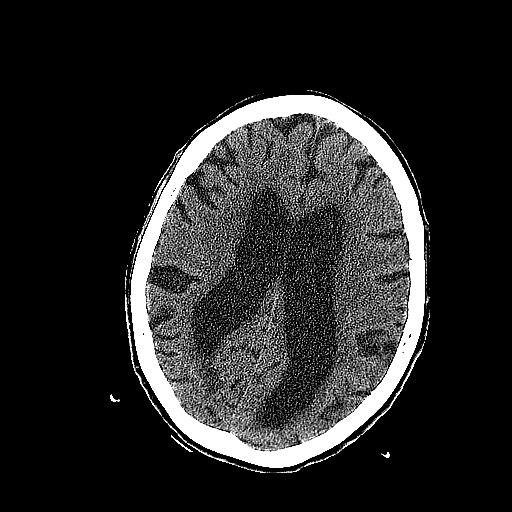
[im 35/56  brain]
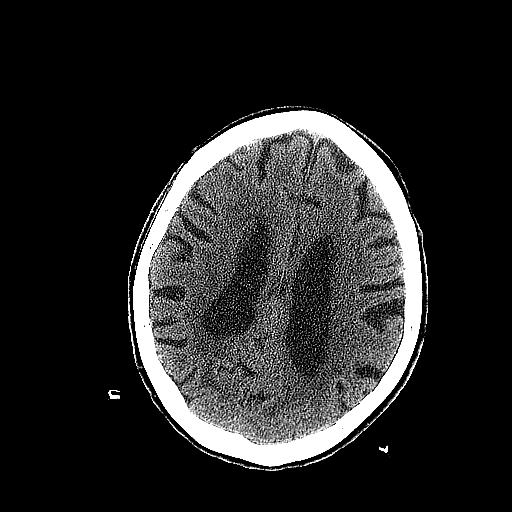
[im 38/56  brain]
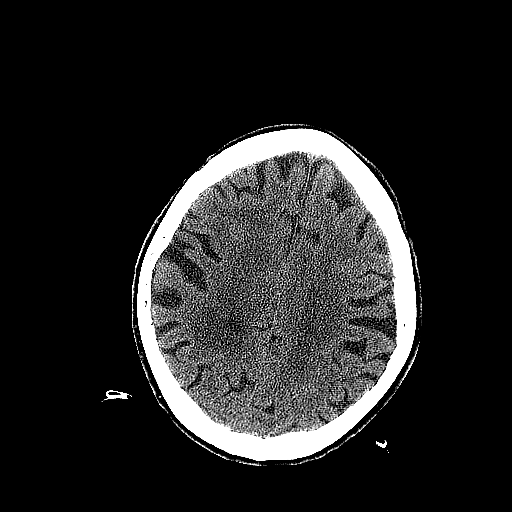
[im 44/56  brain]
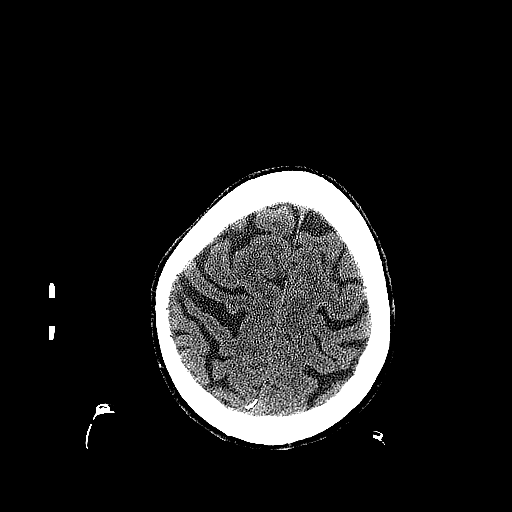
[im 44/56  bone]
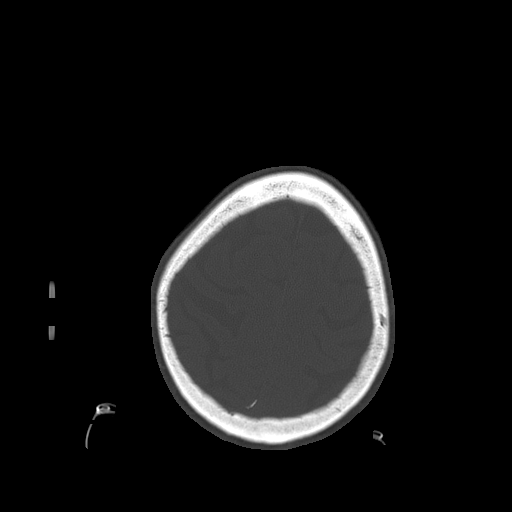
[im 47/56  brain]
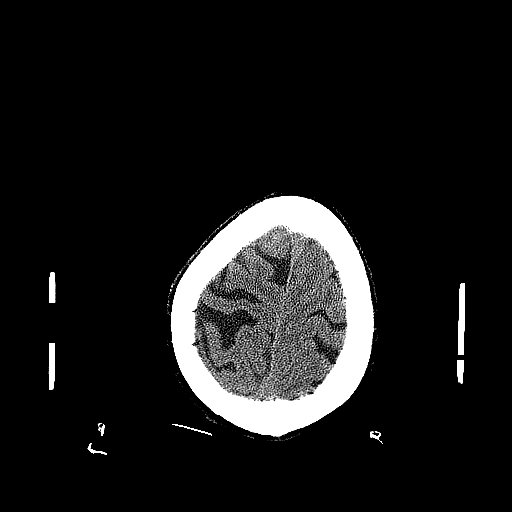
[im 50/56  brain]
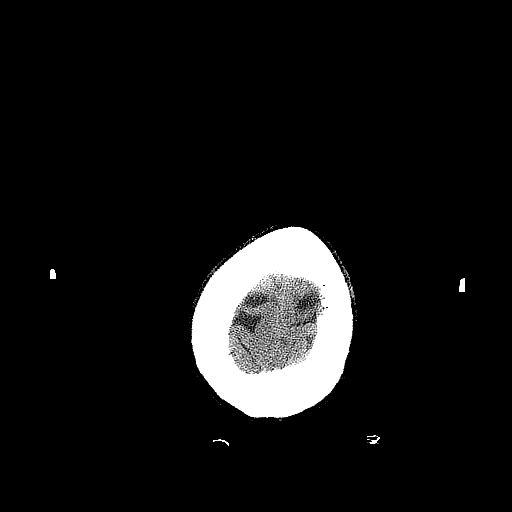
[im 53/56  brain]
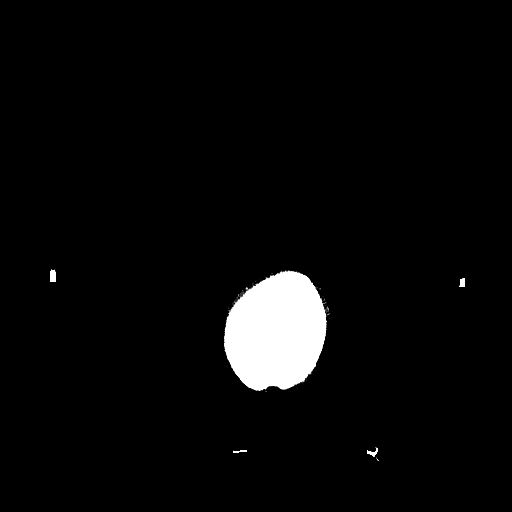

[16 of 30 positions shown; findings below may reference images not displayed]

FINDINGS: Stable degree of diffuse cortical volume loss with
ventricular prominence.  Periventricular white matter hypodensity
likely indicate small vessel ischemic change.  No acute hemorrhage,
acute infarction, or mass lesion is seen.  Left parietal prominent
vascular space or lacunar infarct again noted.  Ethmoid and
sphenoid mucoperiosteal thickening again noted.  No skull fracture.
Right frontal scalp swelling noted.
IMPRESSION: Frontal right scalp swelling without underlying acute intracranial
abnormality or significant interval change.

## 2011-05-20 ENCOUNTER — Other Ambulatory Visit: Payer: Self-pay

## 2011-05-20 ENCOUNTER — Emergency Department (HOSPITAL_COMMUNITY)
Admission: EM | Admit: 2011-05-20 | Discharge: 2011-05-20 | Payer: Medicare Other | Attending: Emergency Medicine | Admitting: Emergency Medicine

## 2011-05-20 ENCOUNTER — Encounter (HOSPITAL_COMMUNITY): Payer: Self-pay | Admitting: Emergency Medicine

## 2011-05-20 DIAGNOSIS — M129 Arthropathy, unspecified: Secondary | ICD-10-CM | POA: Insufficient documentation

## 2011-05-20 DIAGNOSIS — R231 Pallor: Secondary | ICD-10-CM | POA: Insufficient documentation

## 2011-05-20 DIAGNOSIS — R42 Dizziness and giddiness: Secondary | ICD-10-CM | POA: Insufficient documentation

## 2011-05-20 DIAGNOSIS — R404 Transient alteration of awareness: Secondary | ICD-10-CM | POA: Insufficient documentation

## 2011-05-20 DIAGNOSIS — E860 Dehydration: Secondary | ICD-10-CM | POA: Insufficient documentation

## 2011-05-20 DIAGNOSIS — K219 Gastro-esophageal reflux disease without esophagitis: Secondary | ICD-10-CM | POA: Insufficient documentation

## 2011-05-20 DIAGNOSIS — N390 Urinary tract infection, site not specified: Secondary | ICD-10-CM | POA: Insufficient documentation

## 2011-05-20 DIAGNOSIS — R55 Syncope and collapse: Secondary | ICD-10-CM

## 2011-05-20 DIAGNOSIS — I2510 Atherosclerotic heart disease of native coronary artery without angina pectoris: Secondary | ICD-10-CM | POA: Insufficient documentation

## 2011-05-20 DIAGNOSIS — F068 Other specified mental disorders due to known physiological condition: Secondary | ICD-10-CM | POA: Insufficient documentation

## 2011-05-20 DIAGNOSIS — Z79899 Other long term (current) drug therapy: Secondary | ICD-10-CM | POA: Insufficient documentation

## 2011-05-20 DIAGNOSIS — I10 Essential (primary) hypertension: Secondary | ICD-10-CM | POA: Insufficient documentation

## 2011-05-20 DIAGNOSIS — Z7982 Long term (current) use of aspirin: Secondary | ICD-10-CM | POA: Insufficient documentation

## 2011-05-20 HISTORY — DX: Anxiety disorder, unspecified: F41.9

## 2011-05-20 HISTORY — DX: Unspecified dementia, unspecified severity, without behavioral disturbance, psychotic disturbance, mood disturbance, and anxiety: F03.90

## 2011-05-20 HISTORY — DX: Unspecified osteoarthritis, unspecified site: M19.90

## 2011-05-20 HISTORY — DX: Essential (primary) hypertension: I10

## 2011-05-20 HISTORY — DX: Anemia, unspecified: D64.9

## 2011-05-20 HISTORY — DX: Atherosclerotic heart disease of native coronary artery without angina pectoris: I25.10

## 2011-05-20 HISTORY — DX: Gastro-esophageal reflux disease without esophagitis: K21.9

## 2011-05-20 LAB — DIFFERENTIAL
Basophils Absolute: 0 10*3/uL (ref 0.0–0.1)
Basophils Relative: 1 % (ref 0–1)
Eosinophils Relative: 3 % (ref 0–5)
Lymphocytes Relative: 14 % (ref 12–46)
Monocytes Absolute: 0.5 10*3/uL (ref 0.1–1.0)
Monocytes Relative: 8 % (ref 3–12)

## 2011-05-20 LAB — POCT I-STAT TROPONIN I

## 2011-05-20 LAB — CBC
HCT: 32.9 % — ABNORMAL LOW (ref 36.0–46.0)
Hemoglobin: 10.8 g/dL — ABNORMAL LOW (ref 12.0–15.0)
MCHC: 32.8 g/dL (ref 30.0–36.0)
MCV: 90.4 fL (ref 78.0–100.0)
RDW: 13.1 % (ref 11.5–15.5)

## 2011-05-20 LAB — BASIC METABOLIC PANEL
BUN: 34 mg/dL — ABNORMAL HIGH (ref 6–23)
CO2: 25 mEq/L (ref 19–32)
Chloride: 101 mEq/L (ref 96–112)
Creatinine, Ser: 1.19 mg/dL — ABNORMAL HIGH (ref 0.50–1.10)
Potassium: 3.7 mEq/L (ref 3.5–5.1)

## 2011-05-20 LAB — URINALYSIS, ROUTINE W REFLEX MICROSCOPIC
Bilirubin Urine: NEGATIVE
Ketones, ur: NEGATIVE mg/dL
Nitrite: POSITIVE — AB
Specific Gravity, Urine: 1.017 (ref 1.005–1.030)
pH: 6 (ref 5.0–8.0)

## 2011-05-20 LAB — URINE MICROSCOPIC-ADD ON

## 2011-05-20 MED ORDER — CEFTRIAXONE SODIUM 1 G IJ SOLR
1.0000 g | Freq: Once | INTRAMUSCULAR | Status: AC
  Administered 2011-05-20: 1 g via INTRAVENOUS
  Filled 2011-05-20: qty 10

## 2011-05-20 MED ORDER — CEPHALEXIN 250 MG PO CAPS: 250.0000 mg | ORAL_CAPSULE | Freq: Four times a day (QID) | ORAL | Status: AC

## 2011-05-20 MED ORDER — SODIUM CHLORIDE 0.9 % IV BOLUS (SEPSIS)
500.0000 mL | Freq: Once | INTRAVENOUS | Status: AC
  Administered 2011-05-20: 500 mL via INTRAVENOUS

## 2011-05-20 NOTE — ED Notes (Signed)
Pt found by NH staff, pale, diaphoretic with near syncopal episode. PTA bbg 112, EKG, 20g R FA

## 2011-05-20 NOTE — ED Notes (Signed)
Pt incontinent of stool.  Ambulated to bathroom with assistance, pt cleaned and clean catch urine specimen obtained.  Pt tolerated well.

## 2011-05-20 NOTE — ED Notes (Signed)
MD at bedside, Dr. Bebe Shaggy

## 2011-05-20 NOTE — ED Provider Notes (Signed)
History     CSN: [id number] Arrival date & time: 05/20/2011 10:37 AM   First MD Initiated Contact with Patient 05/20/11 1056     Pt seen with medical student, I performed history/physical/documentation    Chief Complaint  Patient presents with  . Near Syncope     Patient is a 77 y.o. female presenting with syncope. The history is provided by the nursing home and the EMS personnel.  Loss of Consciousness This is a new problem. The current episode started less than 1 hour ago. The problem has been gradually improving. Pertinent negatives include no shortness of breath. The symptoms are aggravated by standing. The symptoms are relieved by rest.  Pt was standing at nursing home, appeared pale, felt dizzy, sat down on her own, brief LOC, then came back to her baseline.  BP check at 70/40.  No other issues   Past Medical History  Diagnosis Date  . Dementia   . Arthritis   . Hypertension   . Anemia   . GERD (gastroesophageal reflux disease)   . Anxiety   . Coronary artery disease       History  Substance Use Topics  . Smoking status: Not on file  . Smokeless tobacco: Not on file  . Alcohol Use: No    OB History    Grav Para Term Preterm Abortions TAB SAB Ect Mult Living                  Review of Systems  Unable to perform ROS: Dementia  Respiratory: Negative for shortness of breath.   Cardiovascular: Positive for syncope.    Allergies  Review of patient's allergies indicates no known allergies.  Home Medications   Current Outpatient Rx  Name Route Sig Dispense Refill  . ALPRAZOLAM 0.25 MG PO TABS Oral Take 0.25 mg by mouth at bedtime as needed. For agitation and anxiety     . ASPIRIN 81 MG PO CHEW Oral Chew 81 mg by mouth daily.      Marland Kitchen VITAMIN D3 1000 UNITS PO TABS Oral Take 1,000 Units by mouth daily.      Marland Kitchen FERROUS SULFATE 325 (65 FE) MG PO TABS Oral Take 325 mg by mouth daily.      Marland Kitchen LIDOCAINE 5 % EX PTCH Transdermal Place 1 patch onto the skin daily.  Apply to left knee every day on for 12 hours. Remove & Discard patch within 12 hours or as directed by MD     . LISINOPRIL-HYDROCHLOROTHIAZIDE 20-25 MG PO TABS Oral Take 1 tablet by mouth every morning.      Marland Kitchen MELATONIN 1 MG PO TABS Oral Take 1 tablet by mouth daily. Take daily ay 7pm.     . NAPROXEN 500 MG PO TABS Oral Take 500 mg by mouth 2 (two) times daily.      Marland Kitchen OMEPRAZOLE 20 MG PO CPDR Oral Take 20 mg by mouth daily.      . QUETIAPINE FUMARATE 25 MG PO TABS Oral Take 25 mg by mouth 2 (two) times daily.      Marland Kitchen SIMVASTATIN 10 MG PO TABS Oral Take 10 mg by mouth at bedtime.      . [employer] HYDROCOLLOID 2"X2" EX Apply externally Apply 1 patch topically.      Marland Kitchen ZOLPIDEM TARTRATE 5 MG PO TABS Oral Take 5 mg by mouth at bedtime.     Marland Kitchen LOPERAMIDE HCL 2 MG PO TABS Oral Take 2 mg by mouth as needed. Take 1  tablet after each loose stool. May give up to six tablets in 24 hours as needed.     Marland Kitchen BACITRACIN-NEOMYCIN-POLYMYXIN [id number] EX OINT Topical Apply 1 application topically 2 (two) times daily as needed. To affected area       BP 112/60  Pulse 53  Temp(Src) 97.3 F (36.3 C) (Oral)  Resp 17  SpO2 99%  Physical Exam  CONSTITUTIONAL: Well developed/well nourished HEAD AND FACE: Normocephalic/atraumatic EYES: EOMI  ENMT: Mucous membranes moist NECK: supple no meningeal signs SPINE:entire spine nontender CV: no murmurs/rubs/gallops noted LUNGS: Lungs are clear to auscultation bilaterally, no apparent distress ABDOMEN: soft, nontender, no rebound or guarding GU:no cva tenderness NEURO: Pt is awake/alert, moves all extremitiesx4, pleasantly demented EXTREMITIES: pulses normal, full ROM SKIN: warm, color normal PSYCH: no abnormalities of mood noted   ED Course  Procedures   Labs Reviewed  BASIC METABOLIC PANEL  CBC  DIFFERENTIAL  I-STAT TROPONIN I  URINALYSIS, ROUTINE W REFLEX MICROSCOPIC    Discussed with patient/family She does not want to be admitted IV fluids  ordered Started on rocephin, urine culture sent, just finished macrobid   1:59 PM Pt improved, ambulatory, she is requesting d/c home BP 124/51  Pulse 71  Temp(Src) 97.3 F (36.3 C) (Oral)  Resp 18  SpO2 100%   MDM  Nursing notes reviewed and considered in documentation All labs/vitals reviewed and considered Previous records reviewed and considered     Date: 05/20/2011  Rate: 53  Rhythm: normal sinus rhythm  QRS Axis: left  Intervals: normal  ST/T Wave abnormalities: normal  Conduction Disutrbances:none  Narrative Interpretation:   Old EKG Reviewed: unchanged      Joya Gaskins, MD 05/20/11 1359

## 2011-07-27 IMAGING — CR DG CHEST 2V
2 series · 2 of 2 positions shown · non-contrast
Comparison: 01/07/2010

CLINICAL DATA: fall.   Elevated blood pressure.

CHEST - 2 VIEW

[w chest lat]
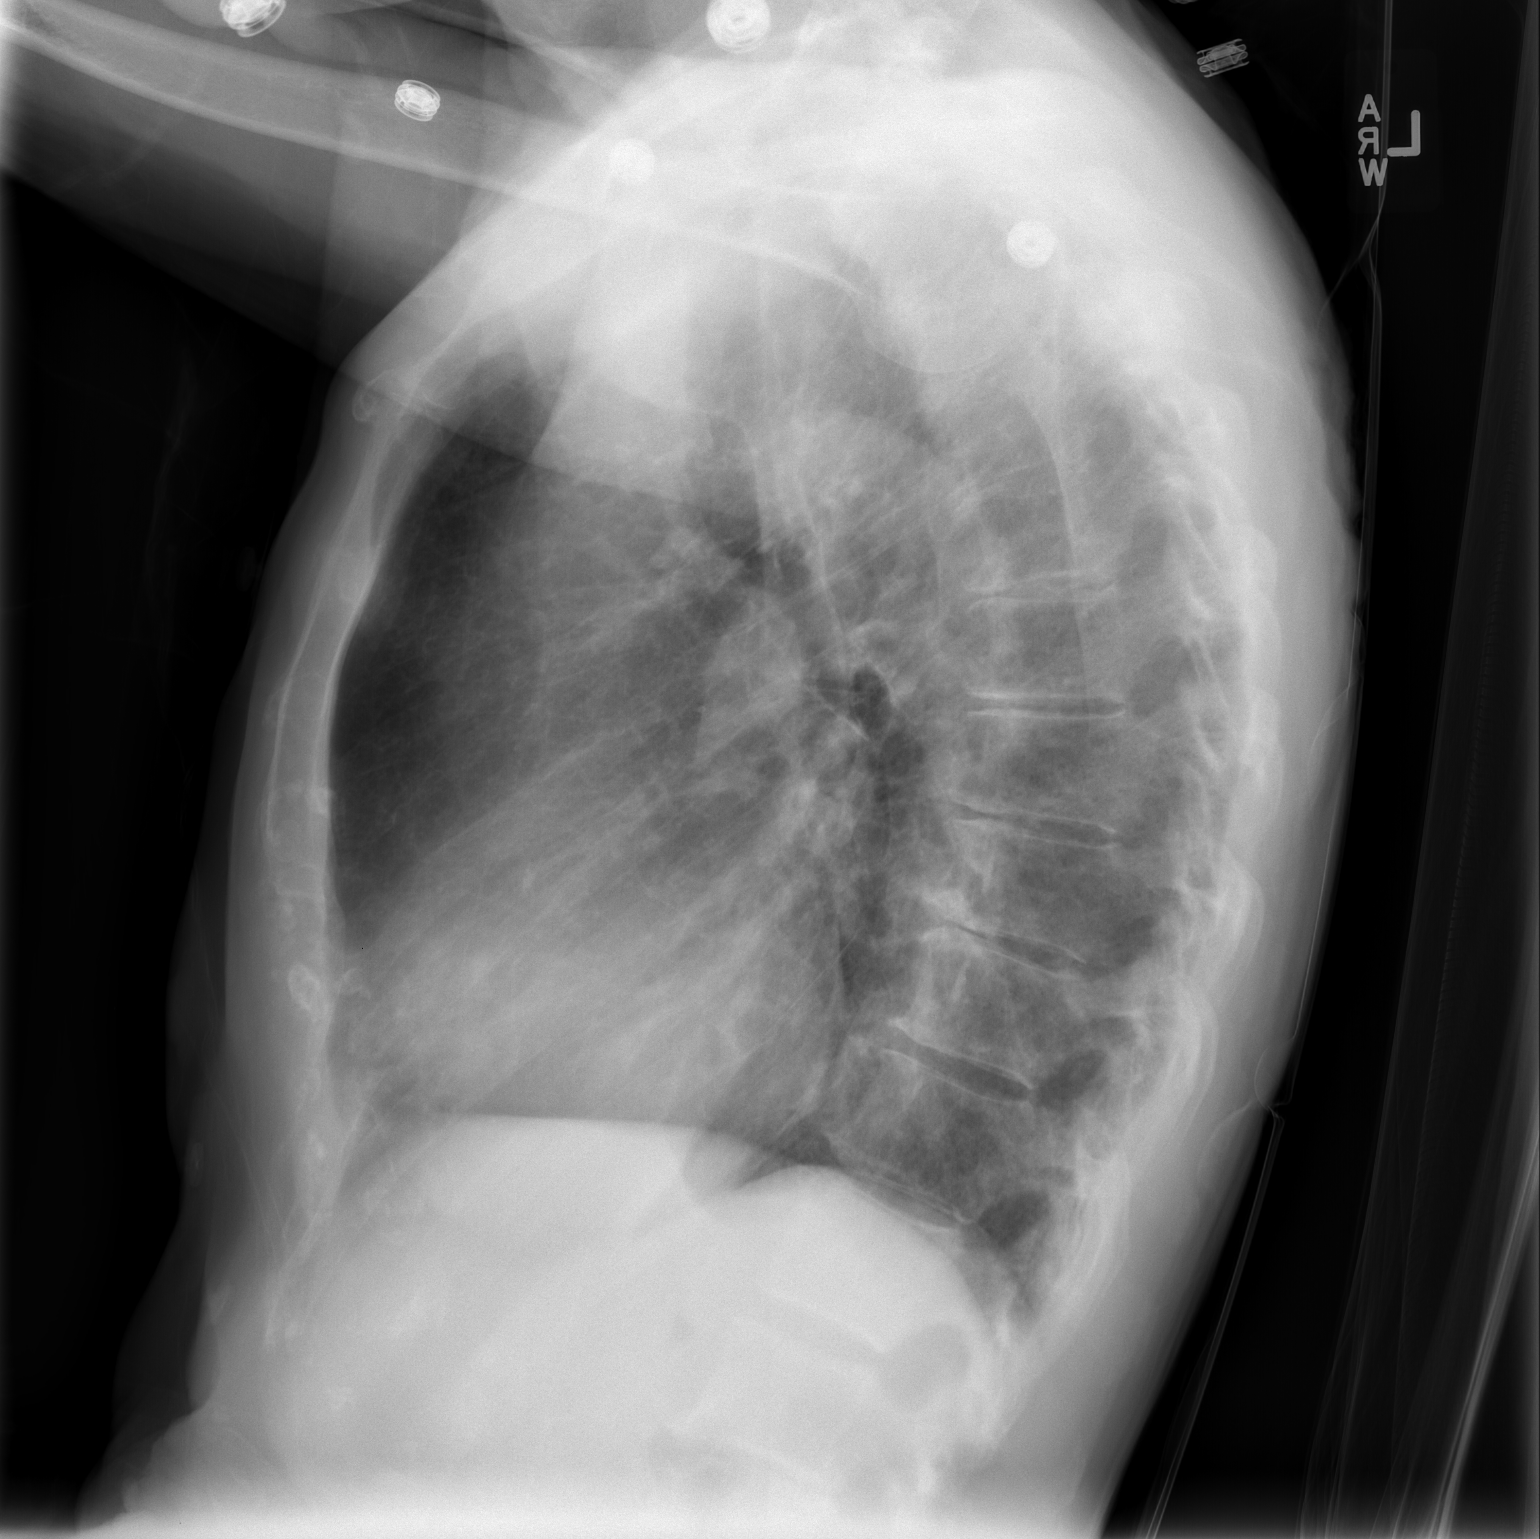

[view not recorded]
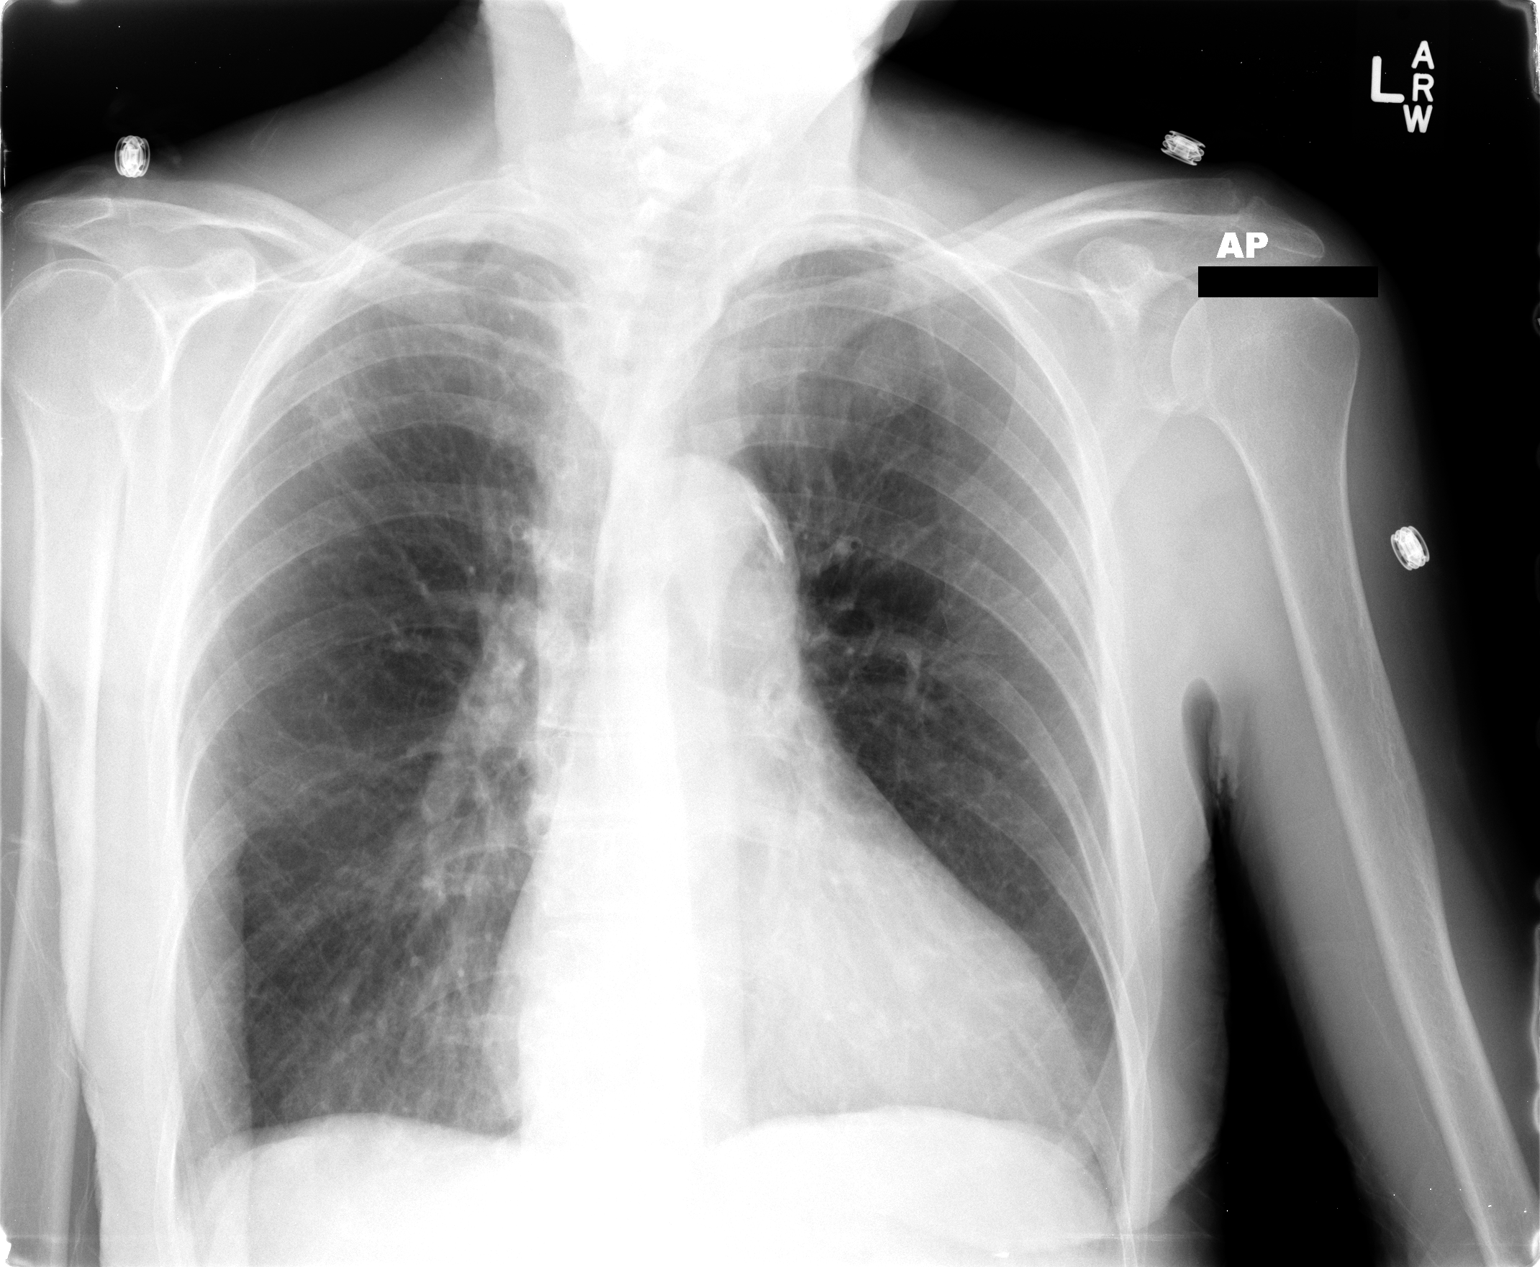

[2 of 2 positions shown; findings below may reference images not displayed]

FINDINGS: Heart size is normal.

No pleural effusion or pulmonary edema.

No airspace consolidation identified.  There is biapical scarring
which appears similar to previous exam.

Spondylosis is noted within the thoracic spine.
IMPRESSION: 1.  No acute cardiopulmonary abnormalities.
2.  Stable biapical scarring.

## 2012-01-14 ENCOUNTER — Emergency Department (HOSPITAL_COMMUNITY): Payer: Medicare Other

## 2012-01-14 ENCOUNTER — Encounter (HOSPITAL_COMMUNITY): Payer: Self-pay | Admitting: Emergency Medicine

## 2012-01-14 ENCOUNTER — Emergency Department (HOSPITAL_COMMUNITY)
Admission: EM | Admit: 2012-01-14 | Discharge: 2012-01-14 | Disposition: A | Discharge status: Skilled Nursing Facility | Payer: Medicare Other | Attending: Emergency Medicine | Admitting: Emergency Medicine

## 2012-01-14 DIAGNOSIS — F0390 Unspecified dementia without behavioral disturbance: Secondary | ICD-10-CM | POA: Insufficient documentation

## 2012-01-14 DIAGNOSIS — I2510 Atherosclerotic heart disease of native coronary artery without angina pectoris: Secondary | ICD-10-CM | POA: Insufficient documentation

## 2012-01-14 DIAGNOSIS — Z79899 Other long term (current) drug therapy: Secondary | ICD-10-CM | POA: Insufficient documentation

## 2012-01-14 DIAGNOSIS — Z7982 Long term (current) use of aspirin: Secondary | ICD-10-CM | POA: Insufficient documentation

## 2012-01-14 DIAGNOSIS — Y9301 Activity, walking, marching and hiking: Secondary | ICD-10-CM | POA: Insufficient documentation

## 2012-01-14 DIAGNOSIS — S1093XA Contusion of unspecified part of neck, initial encounter: Secondary | ICD-10-CM | POA: Insufficient documentation

## 2012-01-14 DIAGNOSIS — W19XXXA Unspecified fall, initial encounter: Secondary | ICD-10-CM

## 2012-01-14 DIAGNOSIS — S0093XA Contusion of unspecified part of head, initial encounter: Secondary | ICD-10-CM

## 2012-01-14 DIAGNOSIS — G319 Degenerative disease of nervous system, unspecified: Secondary | ICD-10-CM | POA: Insufficient documentation

## 2012-01-14 DIAGNOSIS — Z8739 Personal history of other diseases of the musculoskeletal system and connective tissue: Secondary | ICD-10-CM | POA: Insufficient documentation

## 2012-01-14 DIAGNOSIS — S0003XA Contusion of scalp, initial encounter: Secondary | ICD-10-CM | POA: Insufficient documentation

## 2012-01-14 DIAGNOSIS — J329 Chronic sinusitis, unspecified: Secondary | ICD-10-CM | POA: Insufficient documentation

## 2012-01-14 DIAGNOSIS — J438 Other emphysema: Secondary | ICD-10-CM | POA: Insufficient documentation

## 2012-01-14 DIAGNOSIS — I10 Essential (primary) hypertension: Secondary | ICD-10-CM | POA: Insufficient documentation

## 2012-01-14 DIAGNOSIS — W010XXA Fall on same level from slipping, tripping and stumbling without subsequent striking against object, initial encounter: Secondary | ICD-10-CM | POA: Insufficient documentation

## 2012-01-14 DIAGNOSIS — K219 Gastro-esophageal reflux disease without esophagitis: Secondary | ICD-10-CM | POA: Insufficient documentation

## 2012-01-14 DIAGNOSIS — M47812 Spondylosis without myelopathy or radiculopathy, cervical region: Secondary | ICD-10-CM | POA: Insufficient documentation

## 2012-01-14 MED ORDER — DOXYCYCLINE HYCLATE 100 MG PO CAPS: 100.0000 mg | ORAL_CAPSULE | Freq: Two times a day (BID) | ORAL | Status: AC

## 2012-01-14 NOTE — ED Notes (Signed)
[facility] called for pt transportation  

## 2012-01-14 NOTE — ED Notes (Signed)
[facility]ar called x2, reports they will be on route soon

## 2012-01-14 NOTE — ED Notes (Signed)
Per EMS, pt from [facility] facility.  Pt tripped over walker and fell forward, hit head on floor.  Pt with forehead contusion. Pt did not lose consciousness, denies neck or back pain. Pt alert and oriented.

## 2012-01-14 NOTE — ED Notes (Signed)
BJY:[id number]<FA> Expected date:01/14/12<BR> Expected time:12:57 PM<BR> Means of arrival:Ambulance<BR> Comments:<BR> fall

## 2012-01-14 NOTE — ED Provider Notes (Signed)
History     CSN: [id number]  Arrival date & time 01/14/12  1315   First MD Initiated Contact with Patient 01/14/12 1320      Chief Complaint  Patient presents with  . Fall   Level 5 dementia (Consider location/radiation/quality/duration/timing/severity/associated sxs/prior treatment) HPI Patient presents via EMS from memory care unit. It is reported that she was walking without her walker and tripped falling forward and striking her head. She did not lose consciousness. She was transported on a long spine board with a cervical collar in place. She has no complaints. No report of obvious deformity is given to Past Medical History  Diagnosis Date  . Dementia   . Arthritis   . Hypertension   . Anemia   . GERD (gastroesophageal reflux disease)   . Anxiety   . Coronary artery disease     History reviewed. No pertinent past surgical history.  History reviewed. No pertinent family history.  History  Substance Use Topics  . Smoking status: Not on file  . Smokeless tobacco: Not on file  . Alcohol Use: No    OB History    Grav Para Term Preterm Abortions TAB SAB Ect Mult Living                  Review of Systems  All other systems reviewed and are negative.    Allergies  Review of patient's allergies indicates no known allergies.  Home Medications   Current Outpatient Rx  Name Route Sig Dispense Refill  . ASPIRIN 81 MG PO CHEW Oral Chew 81 mg by mouth daily.      Marland Kitchen VITAMIN D3 1000 UNITS PO TABS Oral Take 1,000 Units by mouth daily.      Marland Kitchen CRANBERRY 475 MG PO CAPS Oral Take 1 capsule by mouth 2 (two) times daily.    . DONEPEZIL HCL 10 MG PO TABS Oral Take 10 mg by mouth at bedtime.    Marland Kitchen FERROUS SULFATE 325 (65 FE) MG PO TABS Oral Take 325 mg by mouth daily.      Marland Kitchen LISINOPRIL-HYDROCHLOROTHIAZIDE 20-25 MG PO TABS Oral Take 1 tablet by mouth every morning.     Marland Kitchen MELATONIN 1 MG PO TABS Oral Take 2 tablets by mouth daily. Take daily ay 7pm.    . NAPROXEN 500 MG PO TABS  Oral Take 500 mg by mouth 2 (two) times daily. For pain    . OLANZAPINE 5 MG PO TABS Oral Take 5 mg by mouth at bedtime.    . OMEPRAZOLE 20 MG PO CPDR Oral Take 20 mg by mouth daily.      Marland Kitchen SIMVASTATIN 10 MG PO TABS Oral Take 10 mg by mouth at bedtime.     . CVS HYDROCOLLOID 2"X2" EX Apply externally Apply 1 patch topically.        BP 141/61  Pulse 68  Temp 97.3 F (36.3 C) (Oral)  Resp 16  SpO2 99%  Physical Exam  Nursing note and vitals reviewed. Constitutional: She appears well-developed and well-nourished.  HENT:  Head: Normocephalic.  Right Ear: External ear normal.  Left Ear: External ear normal.  Mouth/Throat: Oropharynx is clear and moist.       Contusion and abrasion right anterior head.  Eyes: EOM are normal. Pupils are equal, round, and reactive to light.  Neck: Normal range of motion. Neck supple.       Cervical collar in place. Cervical traction held on palpation done. No step-offs or tenderness noted on exam.  Cardiovascular: Normal rate, regular rhythm and normal heart sounds.   Pulmonary/Chest: Effort normal and breath sounds normal.  Abdominal: Soft. Bowel sounds are normal.  Musculoskeletal:       Left knee with swelling and scar from previous surgery. There is no tenderness and she is able to flex and extend her left knee. There is a contusion on the medial right upper leg that is somewhat darkened consistent with injury prior to today.  Neurological: She is alert.       The patient is an alert and oriented to person but not place or time.  Skin: Skin is warm and dry.    ED Course  Procedures (including critical care time)  Labs Reviewed - No data to display No results found.   No diagnosis found.   Ct Head Wo Contrast  01/14/2012  *RADIOLOGY REPORT*  Clinical Data:  Status post fall.  Trauma.  Contusion over the forehead.  CT HEAD WITHOUT CONTRAST CT CERVICAL SPINE WITHOUT CONTRAST  Technique:  Multidetector CT imaging of the head and cervical spine  was performed following the standard protocol without intravenous contrast.  Multiplanar CT image reconstructions of the cervical spine were also generated.  Comparison:  CT head 08/06/2010.  CT cervical spine 04/03/2010.  CT HEAD  Findings: Moderate generalized atrophy is present.  Extensive white matter hypoattenuation is evident.  There is no significant interval change.  Ventricular dilation is proportionate to the degree of atrophy.  No hemorrhage or mass lesion is present.  There is no significant extra-axial fluid collection.  The there is partial opacification of the left sphenoid sinus with some evidence for chronic disease.  The paranasal sinuses are otherwise clear.  A left mastoid effusion is evident.  A left paramidline frontal scalp laceration is present.  There is no underlying fracture.  IMPRESSION:  1.  No acute intracranial abnormality or significant interval change. 2.  Advanced atrophy extensive white matter disease likely reflects the sequelae of chronic microvascular ischemia. 3.  Left sphenoid sinus disease. 4.  Left mastoid effusion.  No obstructing nasopharyngeal lesion is evident. 5.  Left paramidline frontal scalp laceration without an underlying fracture.  CT CERVICAL SPINE  Findings: The cervical spine is imaged from skull base through T3- 4.  The vertebral body heights are maintained.  Moderate spondylosis of the cervical spine is most evident from C3-4 through C5-6.  No acute fractures are present.  There is no traumatic subluxation.  The soft tissues demonstrate stable atherosclerotic calcifications.  Mild heterogeneity of the thyroid is stable. Emphysematous changes are again noted at the lung apices with stable biapical pleural parenchymal scarring.  IMPRESSION:  1.  No acute fracture or traumatic subluxation. 2.  Stable spondylosis of the mid cervical spine. 3.  Emphysema and biapical pleural parenchymal scarring is stable. 4.  Stable atherosclerotic changes.  Original Report  Authenticated By: Jamesetta Orleans. MATTERN, M.D.   Ct Cervical Spine Wo Contrast  01/14/2012  *RADIOLOGY REPORT*  Clinical Data:  Status post fall.  Trauma.  Contusion over the forehead.  CT HEAD WITHOUT CONTRAST CT CERVICAL SPINE WITHOUT CONTRAST  Technique:  Multidetector CT imaging of the head and cervical spine was performed following the standard protocol without intravenous contrast.  Multiplanar CT image reconstructions of the cervical spine were also generated.  Comparison:  CT head 08/06/2010.  CT cervical spine 04/03/2010.  CT HEAD  Findings: Moderate generalized atrophy is present.  Extensive white matter hypoattenuation is evident.  There is no significant interval change.  Ventricular dilation is proportionate to the degree of atrophy.  No hemorrhage or mass lesion is present.  There is no significant extra-axial fluid collection.  The there is partial opacification of the left sphenoid sinus with some evidence for chronic disease.  The paranasal sinuses are otherwise clear.  A left mastoid effusion is evident.  A left paramidline frontal scalp laceration is present.  There is no underlying fracture.  IMPRESSION:  1.  No acute intracranial abnormality or significant interval change. 2.  Advanced atrophy extensive white matter disease likely reflects the sequelae of chronic microvascular ischemia. 3.  Left sphenoid sinus disease. 4.  Left mastoid effusion.  No obstructing nasopharyngeal lesion is evident. 5.  Left paramidline frontal scalp laceration without an underlying fracture.  CT CERVICAL SPINE  Findings: The cervical spine is imaged from skull base through T3- 4.  The vertebral body heights are maintained.  Moderate spondylosis of the cervical spine is most evident from C3-4 through C5-6.  No acute fractures are present.  There is no traumatic subluxation.  The soft tissues demonstrate stable atherosclerotic calcifications.  Mild heterogeneity of the thyroid is stable. Emphysematous changes are  again noted at the lung apices with stable biapical pleural parenchymal scarring.  IMPRESSION:  1.  No acute fracture or traumatic subluxation. 2.  Stable spondylosis of the mid cervical spine. 3.  Emphysema and biapical pleural parenchymal scarring is stable. 4.  Stable atherosclerotic changes.  Original Report Authenticated By: Jamesetta Orleans. MATTERN, M.D.   MDM  No results found.   Patient without evidence of acute trauma on head CT or cervical spine CT. Collar is removed. She does have evidence of left sphenoid sinusitis and mastoid effusion. She'll be placed on antibiotics and advised followup by her primary care Dr.     Hilario Quarry, MD 01/14/12 [phone]

## 2013-06-13 ENCOUNTER — Emergency Department (HOSPITAL_COMMUNITY)
Admission: EM | Admit: 2013-06-13 | Discharge: 2013-06-13 | Disposition: A | Discharge status: Home / Self Care | Payer: Medicare Other | Attending: Emergency Medicine | Admitting: Emergency Medicine

## 2013-06-13 ENCOUNTER — Encounter (HOSPITAL_COMMUNITY): Payer: Self-pay | Admitting: Emergency Medicine

## 2013-06-13 ENCOUNTER — Emergency Department (HOSPITAL_COMMUNITY): Payer: Medicare Other

## 2013-06-13 DIAGNOSIS — W06XXXA Fall from bed, initial encounter: Secondary | ICD-10-CM | POA: Insufficient documentation

## 2013-06-13 DIAGNOSIS — K219 Gastro-esophageal reflux disease without esophagitis: Secondary | ICD-10-CM | POA: Insufficient documentation

## 2013-06-13 DIAGNOSIS — F0390 Unspecified dementia without behavioral disturbance: Secondary | ICD-10-CM | POA: Insufficient documentation

## 2013-06-13 DIAGNOSIS — M25569 Pain in unspecified knee: Secondary | ICD-10-CM | POA: Insufficient documentation

## 2013-06-13 DIAGNOSIS — Y9210 Unspecified residential institution as the place of occurrence of the external cause: Secondary | ICD-10-CM | POA: Insufficient documentation

## 2013-06-13 DIAGNOSIS — Y939 Activity, unspecified: Secondary | ICD-10-CM | POA: Insufficient documentation

## 2013-06-13 DIAGNOSIS — F411 Generalized anxiety disorder: Secondary | ICD-10-CM | POA: Insufficient documentation

## 2013-06-13 DIAGNOSIS — M129 Arthropathy, unspecified: Secondary | ICD-10-CM | POA: Insufficient documentation

## 2013-06-13 DIAGNOSIS — D649 Anemia, unspecified: Secondary | ICD-10-CM | POA: Insufficient documentation

## 2013-06-13 DIAGNOSIS — Z79899 Other long term (current) drug therapy: Secondary | ICD-10-CM | POA: Insufficient documentation

## 2013-06-13 DIAGNOSIS — Z87891 Personal history of nicotine dependence: Secondary | ICD-10-CM | POA: Insufficient documentation

## 2013-06-13 DIAGNOSIS — I2510 Atherosclerotic heart disease of native coronary artery without angina pectoris: Secondary | ICD-10-CM | POA: Insufficient documentation

## 2013-06-13 DIAGNOSIS — G8929 Other chronic pain: Secondary | ICD-10-CM | POA: Insufficient documentation

## 2013-06-13 DIAGNOSIS — I10 Essential (primary) hypertension: Secondary | ICD-10-CM | POA: Insufficient documentation

## 2013-06-13 DIAGNOSIS — Z7982 Long term (current) use of aspirin: Secondary | ICD-10-CM | POA: Insufficient documentation

## 2013-06-13 DIAGNOSIS — S0003XA Contusion of scalp, initial encounter: Secondary | ICD-10-CM

## 2013-06-13 NOTE — ED Provider Notes (Signed)
CSN: [id number]     Arrival date & time 06/13/13  0051 History   First MD Initiated Contact with Patient 06/13/13 0125     Chief Complaint  Patient presents with  . Fall    HPI  Patient is a resident at a Alzheimer[facility] unit. She had a fall. She fell from her bed which is approximately 1 foot onto the ground. His chronic left knee pain and swelling from arthritis. Complains of her knee and a "bump on my head". Unknown loss of consciousness. Awake here. Dementia. No no other reported abnormalities or findings  Past Medical History  Diagnosis Date  . Dementia   . Arthritis   . Hypertension   . Anemia   . GERD (gastroesophageal reflux disease)   . Anxiety   . Coronary artery disease    History reviewed. No pertinent past surgical history. No family history on file. History  Substance Use Topics  . Smoking status: Former Games developer  . Smokeless tobacco: Not on file  . Alcohol Use: No   OB History   Grav Para Term Preterm Abortions TAB SAB Ect Mult Living                 Review of Systems  Unable to perform ROS: Dementia    Allergies  Review of patient's allergies indicates no known allergies.  Home Medications   Current Outpatient Rx  Name  Route  Sig  Dispense  Refill  . aspirin 81 MG chewable tablet   Oral   Chew 81 mg by mouth daily.           . cholecalciferol (VITAMIN D-400) 400 UNITS TABS tablet   Oral   Take 800 Units by mouth daily.         . Cranberry 475 MG CAPS   Oral   Take 1 capsule by mouth 2 (two) times daily.         . ferrous sulfate 325 (65 FE) MG tablet   Oral   Take 325 mg by mouth daily.           Marland Kitchen lisinopril (PRINIVIL,ZESTRIL) 20 MG tablet   Oral   Take 20 mg by mouth daily.         Marland Kitchen loperamide (IMODIUM A-D) 2 MG tablet   Oral   Take 2 mg by mouth 4 (four) times daily as needed for diarrhea or loose stools.         . Melatonin 1 MG TABS   Oral   Take 2 tablets by mouth daily. Take daily ay 7pm.         . mineral oil  liquid   Both Ears   Place into both ears once a week. 2 drops on wednesdays         . OLANZapine (ZYPREXA) 2.5 MG tablet   Oral   Take 2.5 mg by mouth daily.         Marland Kitchen omeprazole (PRILOSEC) 20 MG capsule   Oral   Take 20 mg by mouth daily.           . simvastatin (ZOCOR) 10 MG tablet   Oral   Take 10 mg by mouth at bedtime.           BP 150/58  Pulse 77  Temp(Src) 97.8 F (36.6 C) (Oral)  Resp 20  Ht 5\' 3"  (1.6 m)  Wt 140 lb (63.504 kg)  BMI 24.81 kg/m2  SpO2 100% Physical Exam  Constitutional: She appears well-developed  and well-nourished. No distress.  HENT:  Head: Normocephalic.    Eyes: Conjunctivae are normal. Pupils are equal, round, and reactive to light. No scleral icterus.  Neck: Normal range of motion. Neck supple. No thyromegaly present.  Cardiovascular: Normal rate and regular rhythm.  Exam reveals no gallop and no friction rub.   No murmur heard. Pulmonary/Chest: Effort normal and breath sounds normal. No respiratory distress. She has no wheezes. She has no rales.  Abdominal: Soft. Bowel sounds are normal. She exhibits no distension. There is no tenderness. There is no rebound.  Musculoskeletal: Normal range of motion.  Neurological: She is alert.  Skin: Skin is warm and dry. No rash noted.    ED Course  Procedures (including critical care time) Labs Review Labs Reviewed - No data to display Imaging Review Ct Head Wo Contrast  06/13/2013   CLINICAL DATA:  Patient found on floor; scalp hematoma at the right forehead.  EXAM: CT HEAD WITHOUT CONTRAST  TECHNIQUE: Contiguous axial images were obtained from the base of the skull through the vertex without intravenous contrast.  COMPARISON:  CT of the head performed 01/14/2012  FINDINGS: There is no evidence of acute infarction, mass lesion, or intra- or extra-axial hemorrhage on CT.  Prominence of the ventricles and sulci reflects moderate cortical volume loss. Diffuse periventricular and subcortical  white matter change likely reflects small vessel ischemic microangiopathy  The brainstem and fourth ventricle are within normal limits. The basal ganglia are unremarkable in appearance. The cerebral hemispheres demonstrate grossly normal gray-white differentiation. No mass effect or midline shift is seen.  There is no evidence of fracture; visualized osseous structures are unremarkable in appearance. The orbits are within normal limits. The paranasal sinuses and mastoid air cells are well-aerated. Soft tissue swelling is noted overlying the high right frontal calvarium.  IMPRESSION: 1. No evidence of traumatic intracranial injury or fracture. 2. Soft tissue swelling noted overlying the right frontal calvarium. 3. Moderate cortical volume loss and diffuse small vessel ischemic microangiopathy.   Electronically Signed   By: Roanna Raider M.D.   On: 06/13/2013 02:57   Dg Knee Complete 4 Views Left  06/13/2013   CLINICAL DATA:  Status post fall; left knee pain.  EXAM: LEFT KNEE - COMPLETE 4+ VIEW  COMPARISON:  Left knee radiographs performed 04/26/2010  FINDINGS: The previously noted lucency about the femoral component has improved, with interval remodeling about the total knee arthroplasty. Lucency about the tibial component is grossly unchanged. Heterotopic bone formation is seen, particularly about the distal quadriceps tendon. A small knee joint effusion is suspected. No significant soft tissue abnormalities are otherwise seen.  There is no evidence of fracture or dislocation. The arthroplasty appears intact.  IMPRESSION: 1. No evidence of fracture or dislocation. 2. Stable chronic lucency about the tibial component may reflect mild chronic loosening or granulomatosis; lucency about the femoral component has improved, with interval remodeling about the arthroplasty. 3. Small knee joint effusion noted.   Electronically Signed   By: Roanna Raider M.D.   On: 06/13/2013 03:45    EKG Interpretation   None         MDM   1. Scalp contusion, initial encounter    CT shows no acute abnormalities. Patient is appropriate for outpatient treatment. Normal vital signs.    Rolland Porter, MD 06/13/13 [phone]

## 2013-06-13 NOTE — ED Notes (Signed)
Pt returned from CT.  Tech reported pt cried out in pain re: left knee when she was moved from stretcher to gantry.  Upon inspection, left knee appeared much larger than the right.

## 2013-06-13 NOTE — ED Notes (Signed)
Pt removed pulse ox, became upset with me when I tried to replace it; shook her finger at me saying "get out of here, get out of here" in an angry voice.

## 2013-06-13 NOTE — ED Notes (Signed)
Awaiting disposition.  Pt awake and confused.

## 2013-06-13 NOTE — ED Notes (Addendum)
Pt left via [facility].  Alert, remains confused, baseline for pt. Unable to call report to facility, no local phone number.

## 2013-06-13 NOTE — ED Notes (Signed)
Staff at [facility] found pt on floor.  Assume she fell out of bed.  Bed is approx one foot off the ground.  LOC unknown.  Contusion/hematoma to right forehead.

## 2013-09-01 ENCOUNTER — Emergency Department (HOSPITAL_COMMUNITY): Payer: Medicare Other

## 2013-09-01 ENCOUNTER — Emergency Department (HOSPITAL_COMMUNITY)
Admission: EM | Admit: 2013-09-01 | Discharge: 2013-09-01 | Disposition: A | Discharge status: Home / Self Care | Payer: Medicare Other | Attending: Emergency Medicine | Admitting: Emergency Medicine

## 2013-09-01 ENCOUNTER — Encounter (HOSPITAL_COMMUNITY): Payer: Self-pay | Admitting: Emergency Medicine

## 2013-09-01 DIAGNOSIS — K219 Gastro-esophageal reflux disease without esophagitis: Secondary | ICD-10-CM | POA: Insufficient documentation

## 2013-09-01 DIAGNOSIS — W050XXA Fall from non-moving wheelchair, initial encounter: Secondary | ICD-10-CM | POA: Insufficient documentation

## 2013-09-01 DIAGNOSIS — Z87891 Personal history of nicotine dependence: Secondary | ICD-10-CM | POA: Insufficient documentation

## 2013-09-01 DIAGNOSIS — I2510 Atherosclerotic heart disease of native coronary artery without angina pectoris: Secondary | ICD-10-CM | POA: Insufficient documentation

## 2013-09-01 DIAGNOSIS — S0083XA Contusion of other part of head, initial encounter: Secondary | ICD-10-CM

## 2013-09-01 DIAGNOSIS — D649 Anemia, unspecified: Secondary | ICD-10-CM | POA: Insufficient documentation

## 2013-09-01 DIAGNOSIS — W19XXXA Unspecified fall, initial encounter: Secondary | ICD-10-CM

## 2013-09-01 DIAGNOSIS — Y9210 Unspecified residential institution as the place of occurrence of the external cause: Secondary | ICD-10-CM | POA: Insufficient documentation

## 2013-09-01 DIAGNOSIS — I10 Essential (primary) hypertension: Secondary | ICD-10-CM | POA: Insufficient documentation

## 2013-09-01 DIAGNOSIS — Z79899 Other long term (current) drug therapy: Secondary | ICD-10-CM | POA: Insufficient documentation

## 2013-09-01 DIAGNOSIS — S0003XA Contusion of scalp, initial encounter: Secondary | ICD-10-CM | POA: Insufficient documentation

## 2013-09-01 DIAGNOSIS — F411 Generalized anxiety disorder: Secondary | ICD-10-CM | POA: Insufficient documentation

## 2013-09-01 DIAGNOSIS — S1093XA Contusion of unspecified part of neck, initial encounter: Principal | ICD-10-CM

## 2013-09-01 DIAGNOSIS — Z7982 Long term (current) use of aspirin: Secondary | ICD-10-CM | POA: Insufficient documentation

## 2013-09-01 DIAGNOSIS — N390 Urinary tract infection, site not specified: Secondary | ICD-10-CM | POA: Insufficient documentation

## 2013-09-01 DIAGNOSIS — F0390 Unspecified dementia without behavioral disturbance: Secondary | ICD-10-CM | POA: Insufficient documentation

## 2013-09-01 DIAGNOSIS — M129 Arthropathy, unspecified: Secondary | ICD-10-CM | POA: Insufficient documentation

## 2013-09-01 DIAGNOSIS — Y9389 Activity, other specified: Secondary | ICD-10-CM | POA: Insufficient documentation

## 2013-09-01 LAB — URINALYSIS, ROUTINE W REFLEX MICROSCOPIC
Bilirubin Urine: NEGATIVE
Glucose, UA: NEGATIVE mg/dL
Hgb urine dipstick: NEGATIVE
Ketones, ur: NEGATIVE mg/dL
Nitrite: POSITIVE — AB
Protein, ur: NEGATIVE mg/dL
Specific Gravity, Urine: 1.012 (ref 1.005–1.030)
Urobilinogen, UA: 0.2 mg/dL (ref 0.0–1.0)
pH: 7 (ref 5.0–8.0)

## 2013-09-01 LAB — URINE MICROSCOPIC-ADD ON

## 2013-09-01 MED ORDER — CEPHALEXIN 500 MG PO CAPS: 500.0000 mg | ORAL_CAPSULE | Freq: Two times a day (BID) | ORAL | Status: DC

## 2013-09-01 NOTE — ED Notes (Signed)
[facility]

## 2013-09-01 NOTE — ED Notes (Signed)
Bed: WA10 Expected date:  Expected time:  Means of arrival:  Comments: EMS fall 

## 2013-09-01 NOTE — Discharge Instructions (Signed)
Fall Prevention and Home Safety °Falls cause injuries and can affect all age groups. It is possible to use preventive measures to significantly decrease the likelihood of falls. There are many simple measures which can make your home safer and prevent falls. °OUTDOORS °· Repair cracks and edges of walkways and driveways. °· Remove high doorway thresholds. °· Trim shrubbery on the main path into your home. °· Have good outside lighting. °· Clear walkways of tools, rocks, debris, and clutter. °· Check that handrails are not broken and are securely fastened. Both sides of steps should have handrails. °· Have leaves, snow, and ice cleared regularly. °· Use sand or salt on walkways during winter months. °· In the garage, clean up grease or oil spills. °BATHROOM °· Install night lights. °· Install grab bars by the toilet and in the tub and shower. °· Use non-skid mats or decals in the tub or shower. °· Place a plastic non-slip stool in the shower to sit on, if needed. °· Keep floors dry and clean up all water on the floor immediately. °· Remove soap buildup in the tub or shower on a regular basis. °· Secure bath mats with non-slip, double-sided rug tape. °· Remove throw rugs and tripping hazards from the floors. °BEDROOMS °· Install night lights. °· Make sure a bedside light is easy to reach. °· Do not use oversized bedding. °· Keep a telephone by your bedside. °· Have a firm chair with side arms to use for getting dressed. °· Remove throw rugs and tripping hazards from the floor. °KITCHEN °· Keep handles on pots and pans turned toward the center of the stove. Use back burners when possible. °· Clean up spills quickly and allow time for drying. °· Avoid walking on wet floors. °· Avoid hot utensils and knives. °· Position shelves so they are not too high or low. °· Place commonly used objects within easy reach. °· If necessary, use a sturdy step stool with a grab bar when reaching. °· Keep electrical cables out of the  way. °· Do not use floor polish or wax that makes floors slippery. If you must use wax, use non-skid floor wax. °· Remove throw rugs and tripping hazards from the floor. °STAIRWAYS °· Never leave objects on stairs. °· Place handrails on both sides of stairways and use them. Fix any loose handrails. Make sure handrails on both sides of the stairways are as long as the stairs. °· Check carpeting to make sure it is firmly attached along stairs. Make repairs to worn or loose carpet promptly. °· Avoid placing throw rugs at the top or bottom of stairways, or properly secure the rug with carpet tape to prevent slippage. Get rid of throw rugs, if possible. °· Have an electrician put in a light switch at the top and bottom of the stairs. °OTHER FALL PREVENTION TIPS °· Wear low-heel or rubber-soled shoes that are supportive and fit well. Wear closed toe shoes. °· When using a stepladder, make sure it is fully opened and both spreaders are firmly locked. Do not climb a closed stepladder. °· Add color or contrast paint or tape to grab bars and handrails in your home. Place contrasting color strips on first and last steps. °· Learn and use mobility aids as needed. Install an electrical emergency response system. °· Turn on lights to avoid dark areas. Replace light bulbs that burn out immediately. Get light switches that glow. °· Arrange furniture to create clear pathways. Keep furniture in the same place. °·   Firmly attach carpet with non-skid or double-sided tape. °· Eliminate uneven floor surfaces. °· Select a carpet pattern that does not visually hide the edge of steps. °· Be aware of all pets. °OTHER HOME SAFETY TIPS °· Set the water temperature for 120° F (48.8° C). °· Keep emergency numbers on or near the telephone. °· Keep smoke detectors on every level of the home and near sleeping areas. °Document Released: 05/29/2002 Document Revised: 12/08/2011 Document Reviewed: 08/28/2011 °ExitCare® Patient Information ©2014  [employer]. ° °Facial or Scalp Contusion °A facial or scalp contusion is a deep bruise on the face or head. Injuries to the face and head generally cause a lot of swelling, especially around the eyes. Contusions are the result of an injury that caused bleeding under the skin. The contusion may turn blue, purple, or yellow. Minor injuries will give you a painless contusion, but more severe contusions may stay painful and swollen for a few weeks.  °CAUSES  °A facial or scalp contusion is caused by a blunt injury or trauma to the face or head area.  °SIGNS AND SYMPTOMS  °· Swelling of the injured area.   °· Discoloration of the injured area.   °· Tenderness, soreness, or pain in the injured area.   °DIAGNOSIS  °The diagnosis can be made by taking a medical history and doing a physical exam. An X-ray exam, CT scan, or MRI may be needed to determine if there are any associated injuries, such as broken bones (fractures). °TREATMENT  °Often, the best treatment for a facial or scalp contusion is applying cold compresses to the injured area. Over-the-counter medicines may also be recommended for pain control.  °HOME CARE INSTRUCTIONS  °· Only take over-the-counter or prescription medicines as directed by your health care provider.   °· Apply ice to the injured area.   °· Put ice in a plastic bag.   °· Place a towel between your skin and the bag.   °· Leave the ice on for 20 minutes, 2 3 times a day.   °SEEK MEDICAL CARE IF: °· You have bite problems.   °· You have pain with chewing.   °· You are concerned about facial defects. °SEEK IMMEDIATE MEDICAL CARE IF: °· You have severe pain or a headache that is not relieved by medicine.   °· You have unusual sleepiness, confusion, or personality changes.   °· You throw up (vomit).   °· You have a persistent nosebleed.   °· You have double vision or blurred vision.   °· You have fluid drainage from your nose or ear.   °· You have difficulty walking or using your arms or legs.    °MAKE SURE YOU:  °· Understand these instructions. °· Will watch your condition. °· Will get help right away if you are not doing well or get worse. °Document Released: 07/16/2004 Document Revised: 03/29/2013 Document Reviewed: 01/19/2013 °ExitCare® Patient Information ©2014 [employer]. ° °Urinary Tract Infection °Urinary tract infections (UTIs) can develop anywhere along your urinary tract. Your urinary tract is your body's drainage system for removing wastes and extra water. Your urinary tract includes two kidneys, two ureters, a bladder, and a urethra. Your kidneys are a pair of bean-shaped organs. Each kidney is about the size of your fist. They are located below your ribs, one on each side of your spine. °CAUSES °Infections are caused by microbes, which are microscopic organisms, including fungi, viruses, and bacteria. These organisms are so small that they can only be seen through a microscope. Bacteria are the microbes that most commonly cause UTIs. °SYMPTOMS  °Symptoms   of UTIs may vary by age and gender of the patient and by the location of the infection. Symptoms in young women typically include a frequent and intense urge to urinate and a painful, burning feeling in the bladder or urethra during urination. Older women and men are more likely to be tired, shaky, and weak and have muscle aches and abdominal pain. A fever may mean the infection is in your kidneys. Other symptoms of a kidney infection include pain in your back or sides below the ribs, nausea, and vomiting. °DIAGNOSIS °To diagnose a UTI, your caregiver will ask you about your symptoms. Your caregiver also will ask to provide a urine sample. The urine sample will be tested for bacteria and white blood cells. White blood cells are made by your body to help fight infection. °TREATMENT  °Typically, UTIs can be treated with medication. Because most UTIs are caused by a bacterial infection, they usually can be treated with the use of  antibiotics. The choice of antibiotic and length of treatment depend on your symptoms and the type of bacteria causing your infection. °HOME CARE INSTRUCTIONS °· If you were prescribed antibiotics, take them exactly as your caregiver instructs you. Finish the medication even if you feel better after you have only taken some of the medication. °· Drink enough water and fluids to keep your urine clear or pale yellow. °· Avoid caffeine, tea, and carbonated beverages. They tend to irritate your bladder. °· Empty your bladder often. Avoid holding urine for long periods of time. °· Empty your bladder before and after sexual intercourse. °· After a bowel movement, women should cleanse from front to back. Use each tissue only once. °SEEK MEDICAL CARE IF:  °· You have back pain. °· You develop a fever. °· Your symptoms do not begin to resolve within 3 days. °SEEK IMMEDIATE MEDICAL CARE IF:  °· You have severe back pain or lower abdominal pain. °· You develop chills. °· You have nausea or vomiting. °· You have continued burning or discomfort with urination. °MAKE SURE YOU:  °· Understand these instructions. °· Will watch your condition. °· Will get help right away if you are not doing well or get worse. °Document Released: 03/18/2005 Document Revised: 12/08/2011 Document Reviewed: 07/17/2011 °ExitCare® Patient Information ©2014 [employer]. ° °

## 2013-09-01 NOTE — ED Provider Notes (Signed)
CSN: [id number]     Arrival date & time 09/01/13  0840 History   First MD Initiated Contact with Patient 09/01/13 [phone]     Chief Complaint  Patient presents with  . Fall  . Head Laceration     (Consider location/radiation/quality/duration/timing/severity/associated sxs/prior Treatment) HPI Comments: 80 year old female with a history of dementia who fell forward out of her wheelchair while at her nursing facility. She hit her head. She did not lose consciousness. She complains only of bilateral knee pain.  Level V caveat  Patient is a 80 y.o. female presenting with fall.  Fall This is a new problem. The current episode started less than 1 hour ago. Episode frequency: Once. The problem has not changed since onset.Associated symptoms include headaches. Pertinent negatives include no chest pain, no abdominal pain and no shortness of breath. Nothing aggravates the symptoms. Nothing relieves the symptoms.    Past Medical History  Diagnosis Date  . Dementia   . Arthritis   . Hypertension   . Anemia   . GERD (gastroesophageal reflux disease)   . Anxiety   . Coronary artery disease    History reviewed. No pertinent past surgical history. History reviewed. No pertinent family history. History  Substance Use Topics  . Smoking status: Former Games developer  . Smokeless tobacco: Not on file  . Alcohol Use: No   OB History   Grav Para Term Preterm Abortions TAB SAB Ect Mult Living                 Review of Systems  Respiratory: Negative for cough and shortness of breath.   Cardiovascular: Negative for chest pain.  Gastrointestinal: Negative for nausea, vomiting, abdominal pain and diarrhea.  Neurological: Positive for headaches.  All other systems reviewed and are negative.      Allergies  Review of patient's allergies indicates no known allergies.  Home Medications   Current Outpatient Rx  Name  Route  Sig  Dispense  Refill  . aspirin 81 MG chewable tablet   Oral   Chew 81  mg by mouth daily.           . cholecalciferol (VITAMIN D-400) 400 UNITS TABS tablet   Oral   Take 800 Units by mouth daily.         . Cranberry 475 MG CAPS   Oral   Take 1 capsule by mouth 2 (two) times daily.         . ferrous sulfate 325 (65 FE) MG tablet   Oral   Take 325 mg by mouth daily.           Marland Kitchen lisinopril (PRINIVIL,ZESTRIL) 20 MG tablet   Oral   Take 20 mg by mouth daily.         Marland Kitchen loperamide (IMODIUM A-D) 2 MG tablet   Oral   Take 2 mg by mouth every 6 (six) hours as needed for diarrhea or loose stools.          . mineral oil liquid   Both Ears   Place into both ears once a week. 2 drops on wednesdays         . omeprazole (PRILOSEC) 20 MG capsule   Oral   Take 20 mg by mouth daily.           . simvastatin (ZOCOR) 10 MG tablet   Oral   Take 10 mg by mouth at bedtime.          . traMADol (ULTRAM) 50 MG  tablet   Oral   Take 50 mg by mouth 2 (two) times daily.          BP 149/51  Pulse 52  Temp(Src) 97.8 F (36.6 C) (Oral)  Resp 16  SpO2 98% Physical Exam  Nursing note and vitals reviewed. Constitutional: She is oriented to person, place, and time. She appears well-developed and well-nourished. No distress.  HENT:  Head: Normocephalic. Head is with abrasion (right forehead). Head is without raccoon's eyes and without Battle's sign.  Nose: Nose normal.  Eyes: Conjunctivae and EOM are normal. Pupils are equal, round, and reactive to light. No scleral icterus.  Neck: No spinous process tenderness and no muscular tenderness present.  Cardiovascular: Normal rate, regular rhythm, normal heart sounds and intact distal pulses.   No murmur heard. Pulmonary/Chest: Effort normal and breath sounds normal. She has no rales. She exhibits no tenderness.  Abdominal: Soft. There is no tenderness. There is no rebound and no guarding.  Musculoskeletal: Normal range of motion. She exhibits no edema.       Right knee: She exhibits normal range of  motion, no swelling, no effusion and no ecchymosis. Tenderness found.       Left knee: She exhibits swelling. She exhibits normal range of motion, no ecchymosis and no deformity. Tenderness found.       Thoracic back: She exhibits no tenderness and no bony tenderness.       Lumbar back: She exhibits no tenderness and no bony tenderness.  No evidence of trauma to extremities, except as noted.  2+ distal pulses.    Neurological: She is alert and oriented to person, place, and time.  Skin: Skin is warm and dry. No rash noted.  Psychiatric: She has a normal mood and affect.    ED Course  Procedures (including critical care time) Labs Review Labs Reviewed  URINALYSIS, ROUTINE W REFLEX MICROSCOPIC - Abnormal; Notable for the following:    APPearance CLOUDY (*)    Nitrite POSITIVE (*)    Leukocytes, UA SMALL (*)    All other components within normal limits  URINE MICROSCOPIC-ADD ON - Abnormal; Notable for the following:    Bacteria, UA MANY (*)    All other components within normal limits   Imaging Review Ct Head Wo Contrast  09/01/2013   CLINICAL DATA:  Status post fall with laceration of the forehead. Headache and neck pain.  EXAM: CT HEAD WITHOUT CONTRAST  CT CERVICAL SPINE WITHOUT CONTRAST  TECHNIQUE: Multidetector CT imaging of the head and cervical spine was performed following the standard protocol without intravenous contrast. Multiplanar CT image reconstructions of the cervical spine were also generated.  COMPARISON:  Head CT June 13, 2013  FINDINGS: CT HEAD FINDINGS  There is chronic diffuse atrophy. Chronic ventriculomegaly is identified. Chronic bilateral periventricular white matter small vessel ischemic changes are noted. There is no midline shift, hydrocephalus, or mass. No acute hemorrhage or acute transcortical infarct is identified. The bony calvarium is intact. There is a right frontal scalp swelling.  CT CERVICAL SPINE FINDINGS  There are degenerative joint changes of  cervical spine with narrowed joint space and osteophyte formation. The prevertebral soft tissues are normal. There is no acute fracture or dislocation,. Scarring of bilateral apices are noted.  IMPRESSION: No focal acute intracranial abnormality identified. Right frontal scalp swelling.  No acute fracture or dislocation of cervical spine.   Electronically Signed   By: Sherian Rein M.D.   On: 09/01/2013 09:43   Ct Cervical Spine  Wo Contrast  09/01/2013   CLINICAL DATA:  Status post fall with laceration of the forehead. Headache and neck pain.  EXAM: CT HEAD WITHOUT CONTRAST  CT CERVICAL SPINE WITHOUT CONTRAST  TECHNIQUE: Multidetector CT imaging of the head and cervical spine was performed following the standard protocol without intravenous contrast. Multiplanar CT image reconstructions of the cervical spine were also generated.  COMPARISON:  Head CT June 13, 2013  FINDINGS: CT HEAD FINDINGS  There is chronic diffuse atrophy. Chronic ventriculomegaly is identified. Chronic bilateral periventricular white matter small vessel ischemic changes are noted. There is no midline shift, hydrocephalus, or mass. No acute hemorrhage or acute transcortical infarct is identified. The bony calvarium is intact. There is a right frontal scalp swelling.  CT CERVICAL SPINE FINDINGS  There are degenerative joint changes of cervical spine with narrowed joint space and osteophyte formation. The prevertebral soft tissues are normal. There is no acute fracture or dislocation,. Scarring of bilateral apices are noted.  IMPRESSION: No focal acute intracranial abnormality identified. Right frontal scalp swelling.  No acute fracture or dislocation of cervical spine.   Electronically Signed   By: Sherian Rein M.D.   On: 09/01/2013 09:43   Dg Knee Complete 4 Views Left  09/01/2013   CLINICAL DATA:  Knee pain .  EXAM: LEFT KNEE - COMPLETE 4+ VIEW  COMPARISON:  DG KNEE COMPLETE 4 VIEWS*L* dated 06/13/2013  FINDINGS: Patient has had a  prior total knee replacement. Stable lucencies are noted about the component suggesting chronic loosening. Severe degenerative changes are noted with loose bodies. No acute abnormality .  IMPRESSION: 1. Patient has had a prior total knee replacement. Stable changes of chronic loosening noted. Severe degenerative changes. 2. No acute abnormality.  No evidence of fracture or dislocation.   Electronically Signed   By: Maisie Fus  Register   On: 09/01/2013 09:43   Dg Knee Complete 4 Views Right  09/01/2013   CLINICAL DATA:  Knee pain.  EXAM: RIGHT KNEE - COMPLETE 4+ VIEW  COMPARISON:  None.  FINDINGS: Soft tissue structures are normal. Severe tricompartment degenerative change present. Chondrocalcinosis noted consistent degenerative change. Fracture of the lateral tibial spine cannot be completely excluded.  IMPRESSION: 1.  Severe tricompartment degenerative change.  2. Nondisplaced fracture of the lateral tibial spine cannot be completely excluded.   Electronically Signed   By: Maisie Fus  Register   On: 09/01/2013 09:40  All radiology studies independently viewed by me.      EKG Interpretation   Date/Time:  Friday September 01 2013 09:33:22 EDT Ventricular Rate:  73 PR Interval:  194 QRS Duration: 90 QT Interval:  431 QTC Calculation: 475 R Axis:   -53 Text Interpretation:  Sinus rhythm Left anterior fascicular block Low  voltage, precordial leads Abnormal R-wave progression, early transition  Probable LVH with secondary repol abnrm similar to prior. Confirmed by  [facility]  MD, Thurston Pounds ([id number]) on 09/01/2013 1:51:46 PM      MDM   Final diagnoses:  Fall  Forehead contusion  UTI (lower urinary tract infection)    80 year old female with history of dementia. Witnessed fall from wheelchair at nursing facility. Has an abrasion on her forehead which is not amenable to repair.  Plain CT head neck and plain films of knees. Screening EKG and urinalysis.  Workup reveals UTI.  Will treat with Keflex.  Remains well  appearing and nontoxic.  Showed a possible fracture of lateral tibia on right.  However, on re-exam, pt denied any pain in this knee,  had no tenderness, and had good ROM without pain.  Plan DC back to facility.    Candyce ChurnJohn David Marlea Gambill III, MD 09/01/13 [phone]

## 2013-09-01 NOTE — ED Notes (Signed)
Patient transported to X-ray 

## 2013-09-01 NOTE — ED Notes (Signed)
Per EMS- Patient is a resident of [facility]. Patient was in a wheelchair, leaned forward and fell out of the wheelchair. Patient has a 1/2" laceration to the right forehead. Pressure dressing applied. Bleeding controlled. Neuro check unremarkable and patient is able to follow commands.

## 2013-11-21 ENCOUNTER — Non-Acute Institutional Stay (SKILLED_NURSING_FACILITY): Payer: Medicare Other | Admitting: Internal Medicine

## 2013-11-21 DIAGNOSIS — E785 Hyperlipidemia, unspecified: Secondary | ICD-10-CM

## 2013-11-21 DIAGNOSIS — F02818 Dementia in other diseases classified elsewhere, unspecified severity, with other behavioral disturbance: Secondary | ICD-10-CM

## 2013-11-21 DIAGNOSIS — I10 Essential (primary) hypertension: Secondary | ICD-10-CM

## 2013-11-21 DIAGNOSIS — F0281 Dementia in other diseases classified elsewhere with behavioral disturbance: Secondary | ICD-10-CM

## 2013-11-22 ENCOUNTER — Other Ambulatory Visit: Payer: Self-pay | Admitting: *Deleted

## 2013-11-22 MED ORDER — LORAZEPAM 1 MG PO TABS: ORAL_TABLET | ORAL | Status: DC

## 2013-11-22 NOTE — Telephone Encounter (Signed)
[facility] 

## 2013-11-24 NOTE — Progress Notes (Addendum)
Patient ID: Tracy RainsMarjorie Bowers, female   DOB: June 17, 1925, 80 y.o.   MRN: [id number]                  HISTORY & PHYSICAL  DATE:  11/21/2013    [facility]AC[facility]     LEVEL OF CARE:   SNF    HISTORY OF PRESENT ILLNESS:  This is an 80 year-old woman who was transferred from [facility], which is an assisted living facility.  She has probable severe dementia with behavioral disturbances.   The reason for her transfer here, whether it was because of escalating care needs or financial reasons, I am really uncertain of at this point.    PAST MEDICAL HISTORY/PROBLEM LIST:  All from review of her FL2, includes:    Dementia with behavioral disturbances.     Osteoarthritis, status post left knee replacement.    Gastroesophageal reflux disease.    Hypertension.    Anemia.    Chronic kidney disease.    Edema.    Vitamin D deficiency.    Osteopenia.    Insomnia.    History of frequent falls.   Apparently, she has been nonambulatory for the last few years.    CURRENT MEDICATIONS:  Medication list is reviewed.    Tramadol 1 tablet twice daily.    ASA 81 q.d.    Cranberry 1 capsule twice daily.    Ferrous sulfate 325 daily.    Simvastatin 10 mg every evening.    Lisinopril 20 q.d.    Vitamin D 400 IU, 2 tablets daily.     SOCIAL HISTORY:  I know very little about this patient.   CODE STATUS:  She has a DNR on her chart.    FAMILY HISTORY:  Not currently available from any source.    REVIEW OF SYSTEMS:  Probably not meaningful at this point.    PHYSICAL EXAMINATION:   GENERAL APPEARANCE:  80 year-old woman who is not in any distress.   HEENT:   MOUTH/THROAT:   Still has her own teeth.  No oral lesions are seen.   NECK/THYROID:   No masses.  No lymphadenopathy.   CHEST/RESPIRATORY:  Clear air entry bilaterally.   CARDIOVASCULAR:  CARDIAC:   Heart sounds are normal.  She does not appear to be dehydrated.  There are no murmurs. BREASTS:  No masses.     GASTROINTESTINAL:  LIVER/SPLEEN/KIDNEYS:  No liver, no spleen.  No tenderness.   GENITOURINARY:  BLADDER:   No bladder enlargement.  No CVA tenderness.   MUSCULOSKELETAL:   EXTREMITIES:   LEFT LOWER EXTREMITY:   She has had a left total knee replacement.  She has flexion contractures of the left hip and left knee.   NEUROLOGICAL:    SENSATION/STRENGTH:  She has antigravity strength in all her limbs.   OTHER:  No significant parkinsonism.   PSYCHIATRIC:   MENTAL STATUS:   She was able to name her husband,  apparently correctly.  She is able to state her old address.  Does not seem to remember the assisted living.  States she has three children.  All of this is probably compatible with severe dementia.  However, this is not a terminal situation.      ASSESSMENT/PLAN:  History of Alzheimer's disease with behavioral disturbances.  This is advanced.    Hypertension.  On lisinopril.    Hyperlipidemia.  On simvastatin.    On aspirin.  This may be for primary prophylaxis.    The patient had  a CT scan of her head in March of this year after falling and hitting her head.  There was chronic bilateral periventricular white matter disease.  No midline shift.  No acute injury.    CT scan of the C-spine at that point showed no evidence of a fracture or dislocation.

## 2013-11-27 LAB — CBC AND DIFFERENTIAL
HCT: 34 % — AB (ref 36–46)
Hemoglobin: 11.5 g/dL — AB (ref 12.0–16.0)
Platelets: 293 10*3/uL (ref 150–399)
WBC: 10 10*3/mL

## 2013-11-27 LAB — BASIC METABOLIC PANEL
BUN: 29 mg/dL — AB (ref 4–21)
CREATININE: 1 mg/dL (ref 0.5–1.1)
GLUCOSE: 84 mg/dL
POTASSIUM: 4.2 mmol/L (ref 3.4–5.3)
Sodium: 136 mmol/L — AB (ref 137–147)

## 2013-12-04 ENCOUNTER — Non-Acute Institutional Stay (SKILLED_NURSING_FACILITY): Payer: Medicare Other | Admitting: Nurse Practitioner

## 2013-12-04 DIAGNOSIS — Q159 Congenital malformation of eye, unspecified: Secondary | ICD-10-CM

## 2013-12-04 NOTE — Progress Notes (Signed)
Patient ID: Tracy Bowers, female   DOB: [birth year], 80 y.o.   MRN: [id number]    Nursing Home Location:    [facility]  Place of Service:  SNF  PCP: Lupe CarneyMITCHELL,DEAN, MD  No Known Allergies  Chief Complaint  Patient presents with  . Acute Visit    Acute concerns     HPI:  80 year old female with advanced dementia, anxiety, CAD, HTN, anemia is being seen today at the request of nursing per family. Apparently family was concerned left eye was smaller than right. There has been no injury, swelling, redness or drainage but wanted it to be evaluated. Pt with advanced dementia therefore a poor historian but does not report pain of vision changes.   Review of Systems:  Review of Systems  Constitutional: Negative for fever and chills.  Eyes: Negative for blurred vision, double vision, photophobia, pain, discharge and redness.  Respiratory: Negative for shortness of breath.   Cardiovascular: Negative for chest pain.  Skin: Negative for itching.  Neurological: Negative for weakness.     Past Medical History  Diagnosis Date  . Dementia   . Arthritis   . Hypertension   . Anemia   . GERD (gastroesophageal reflux disease)   . Anxiety   . Coronary artery disease    No past surgical history on file. Social History:   reports that she has quit smoking. She does not have any smokeless tobacco history on file. She reports that she does not drink alcohol or use illicit drugs.  No family history on file.  Medications: Patient's Medications  New Prescriptions   No medications on file  Previous Medications   ASPIRIN 81 MG CHEWABLE TABLET    Chew 81 mg by mouth daily.     CHOLECALCIFEROL (VITAMIN D-400) 400 UNITS TABS TABLET    Take 800 Units by mouth daily.   CRANBERRY 475 MG CAPS    Take 1 capsule by mouth 2 (two) times daily.   FERROUS SULFATE 325 (65 FE) MG TABLET    Take 325 mg by mouth daily.     LISINOPRIL (PRINIVIL,ZESTRIL) 20 MG TABLET    Take 20 mg by mouth daily.   MINERAL  OIL LIQUID    Place into both ears once a week. 2 drops on wednesdays   OMEPRAZOLE (PRILOSEC) 20 MG CAPSULE    Take 20 mg by mouth daily.     SIMVASTATIN (ZOCOR) 10 MG TABLET    Take 10 mg by mouth at bedtime.    TRAMADOL (ULTRAM) 50 MG TABLET    Take 50 mg by mouth 2 (two) times daily.  Modified Medications   No medications on file  Discontinued Medications   CEPHALEXIN (KEFLEX) 500 MG CAPSULE    Take 1 capsule (500 mg total) by mouth 2 (two) times daily.   LOPERAMIDE (IMODIUM A-D) 2 MG TABLET    Take 2 mg by mouth every 6 (six) hours as needed for diarrhea or loose stools.    LORAZEPAM (ATIVAN) 1 MG TABLET    Take one tablet by mouth once daily for anxiety     Physical Exam: Physical Exam  HENT:  Head: Normocephalic and atraumatic.  Mouth/Throat: Oropharynx is clear and moist. No oropharyngeal exudate.  Eyes: Conjunctivae, EOM and lids are normal. Pupils are equal, round, and reactive to light. Right eye exhibits no discharge. Left eye exhibits no discharge.  Cardiovascular: Normal rate, regular rhythm and normal heart sounds.   Pulmonary/Chest: Effort normal and breath sounds normal. No respiratory  distress.  Abdominal: Soft. Bowel sounds are normal.  Skin: Skin is warm.    Filed Vitals:   12/04/13 1350  BP: 131/56  Pulse: 69  Temp: 97.9 F (36.6 C)  TempSrc: Oral  Resp: 16  Weight: 130 lb (58.968 kg)  SpO2: 96%       Assessment/Plan  1. Eye abnormalities Per family however left and right eye are symmetrical, no redness, drainage or edema noted and are unremarkable -staff to cont to monitor

## 2014-01-05 ENCOUNTER — Other Ambulatory Visit (HOSPITAL_COMMUNITY): Payer: Self-pay | Admitting: Internal Medicine

## 2014-01-05 DIAGNOSIS — R1310 Dysphagia, unspecified: Secondary | ICD-10-CM

## 2014-01-08 ENCOUNTER — Ambulatory Visit (HOSPITAL_COMMUNITY): Payer: Medicare Other

## 2014-01-09 ENCOUNTER — Ambulatory Visit (HOSPITAL_COMMUNITY)
Admission: RE | Admit: 2014-01-09 | Discharge: 2014-01-09 | Disposition: A | Discharge status: Home / Self Care | Payer: No Typology Code available for payment source | Source: Ambulatory Visit | Attending: Internal Medicine | Admitting: Internal Medicine

## 2014-01-09 ENCOUNTER — Ambulatory Visit (HOSPITAL_COMMUNITY)
Admission: RE | Admit: 2014-01-09 | Discharge: 2014-01-09 | Disposition: A | Discharge status: Home / Self Care | Payer: Medicare Other | Source: Ambulatory Visit | Attending: Internal Medicine | Admitting: Internal Medicine

## 2014-01-09 DIAGNOSIS — R1311 Dysphagia, oral phase: Secondary | ICD-10-CM | POA: Insufficient documentation

## 2014-01-09 DIAGNOSIS — R1310 Dysphagia, unspecified: Secondary | ICD-10-CM | POA: Diagnosis present

## 2014-01-09 NOTE — Procedures (Signed)
Objective Swallowing Evaluation: Modified Barium Swallowing Study  Patient Details  Name: Chezney Huether MRN: [id number] Date of Birth: [birth year]  Today's Date: 01/09/2014 Time: 1140-1200 SLP Time Calculation (min): 20 min  Past Medical History:  Past Medical History  Diagnosis Date  . Dementia   . Arthritis   . Hypertension   . Anemia   . GERD (gastroesophageal reflux disease)   . Anxiety   . Coronary artery disease    Past Surgical History: No past surgical history on file. HPI:  80 year old female with advanced dementia, anxiety, CAD, HTN, anemia      Assessment / Plan / Recommendation Clinical Impression  Dysphagia Diagnosis: Moderate oral phase dysphagia Clinical impression: Pt demonstrates moderate, cognitive based oral dysphagia. Oral holding, tongue pumping and mastication of purees and liquids all noted. Function and automaticity much improved with self feeding via cup. Despite mild premature spillage and also mild oral residuals that pool in valleculae and pyriforms post swallow, the pt did not have any signifcant penetration. There were trace episodes of flash penetration during the swallow only. Pt also demosntrates ability to masticate regular textures. Recommend a regular texture diet with thin liquids, encouraging pt to follow liquids with solids to clear residuals and trigger a second a swallow as cognitively pt appears unable to follow commands consistently.     Treatment Recommendation  Defer treatment plan to SLP at (Comment)    Diet Recommendation Regular;Thin liquid   Liquid Administration via: Cup;Straw Medication Administration: Crushed with puree Supervision: Staff to assist with self feeding Compensations: Follow solids with liquid;Multiple dry swallows after each bite/sip Postural Changes and/or Swallow Maneuvers: Seated upright 90 degrees    Other  Recommendations Oral Care Recommendations: Oral care BID   Follow Up Recommendations  Skilled  Nursing facility    Frequency and Duration        Pertinent Vitals/Pain NA    SLP Swallow Goals     General HPI: 80 year old female with advanced dementia, anxiety, CAD, HTN, anemia  Type of Study: Modified Barium Swallowing Study Reason for Referral: Objectively evaluate swallowing function Previous Swallow Assessment: NA Diet Prior to this Study: Information not available Temperature Spikes Noted: N/A Respiratory Status: Room air History of Recent Intubation: No Behavior/Cognition: Alert;Cooperative;Pleasant mood;Confused Oral Cavity - Dentition: Adequate natural dentition Oral Motor / Sensory Function: Within functional limits Self-Feeding Abilities: Able to feed self;Other (Comment) (needs cueing) Patient Positioning: Upright in chair Baseline Vocal Quality: Clear Volitional Cough: Strong Volitional Swallow: Unable to elicit Anatomy: Within functional limits Pharyngeal Secretions: Not observed secondary MBS    Reason for Referral Objectively evaluate swallowing function   Oral Phase Oral Preparation/Oral Phase Oral Phase: Impaired Oral - Thin Oral - Thin Cup: Delayed oral transit;Lingual/palatal residue;Lingual pumping Oral - Thin Straw: Delayed oral transit;Lingual/palatal residue;Lingual pumping;Holding of bolus Oral - Solids Oral - Puree: Delayed oral transit;Lingual/palatal residue;Lingual pumping (masticating puree) Oral - Regular: Delayed oral transit;Lingual pumping Oral - Pill: Delayed oral transit;Lingual pumping   Pharyngeal Phase Pharyngeal Phase Pharyngeal Phase: Impaired Pharyngeal - Thin Pharyngeal - Thin Cup: Premature spillage to pyriform sinuses;Penetration/Aspiration during swallow Penetration/Aspiration details (thin cup): Material enters airway, remains ABOVE vocal cords then ejected out;Material does not enter airway Pharyngeal - Thin Straw: Premature spillage to pyriform sinuses;Penetration/Aspiration during swallow Penetration/Aspiration  details (thin straw): Material does not enter airway;Material enters airway, remains ABOVE vocal cords then ejected out Pharyngeal - Solids Pharyngeal - Puree: Within functional limits Pharyngeal - Regular: Within functional limits  Cervical Esophageal Phase  GO    Cervical Esophageal Phase Cervical Esophageal Phase: [facility]    Functional Assessment Tool Used: clinical judgement Functional Limitations: Swallowing Swallow Current Status ([id number](G8996): At least 20 percent but less than 40 percent impaired, limited or restricted Swallow Goal Status [phone](G8997): At least 20 percent but less than 40 percent impaired, limited or restricted Swallow Discharge Status [phone](G8998): At least 20 percent but less than 40 percent impaired, limited or restricted   [facility] HospitalBonnie Jennene Downie, MA CCC-SLP [phone]  Claudine MoutonDeBlois, Taevon Aschoff Caroline 01/09/2014, 3:08 PM

## 2014-02-02 ENCOUNTER — Non-Acute Institutional Stay (SKILLED_NURSING_FACILITY): Payer: Medicare Other | Admitting: Nurse Practitioner

## 2014-02-02 DIAGNOSIS — F419 Anxiety disorder, unspecified: Secondary | ICD-10-CM

## 2014-02-02 DIAGNOSIS — F0281 Dementia in other diseases classified elsewhere with behavioral disturbance: Secondary | ICD-10-CM

## 2014-02-02 DIAGNOSIS — F02818 Dementia in other diseases classified elsewhere, unspecified severity, with other behavioral disturbance: Secondary | ICD-10-CM

## 2014-02-02 DIAGNOSIS — K117 Disturbances of salivary secretion: Secondary | ICD-10-CM

## 2014-02-02 DIAGNOSIS — I10 Essential (primary) hypertension: Secondary | ICD-10-CM

## 2014-02-02 DIAGNOSIS — F411 Generalized anxiety disorder: Secondary | ICD-10-CM

## 2014-02-02 DIAGNOSIS — R682 Dry mouth, unspecified: Secondary | ICD-10-CM

## 2014-02-02 DIAGNOSIS — D649 Anemia, unspecified: Secondary | ICD-10-CM

## 2014-02-02 NOTE — Progress Notes (Signed)
Patient ID: Tracy Bowers, female   DOB: 08/18/1924, 80 y.o.   MRN: [id number]    Nursing Home Location:  [facility]   Place of Service: SNF (31)  PCP: MITCHELL,DEAN, MD  No Known Allergies  Chief Complaint  Patient presents with  . Medical Management of Chronic Issues    Routine Visit     HPI:  80 year old female with advanced dementia, anxiety, CAD, HTN, anemia is being seen today for routine follow up on chronic conditions. Pt with advanced dementia and unable to give ROS or contribute to HPI. Pt was previously at [facility] [address] and transferred for higher level of care. Will be a long term resident. Pt working with PT/OT/ST at this time. Pt swallowing function is stable. cognitive decline reported per ST.  OT working with pt on contractures but she does not always participate in activities   Review of Systems:  Unable to obtain  Past Medical History  Diagnosis Date  . Dementia   . Arthritis   . Hypertension   . Anemia   . GERD (gastroesophageal reflux disease)   . Anxiety   . Coronary artery disease    No past surgical history on file. Social History:   reports that she has quit smoking. She does not have any smokeless tobacco history on file. She reports that she does not drink alcohol or use illicit drugs.  No family history on file.  Medications: Patient's Medications  New Prescriptions   No medications on file  Previous Medications   ASPIRIN 81 MG CHEWABLE TABLET    Chew 81 mg by mouth daily.     CHOLECALCIFEROL (VITAMIN D-400) 400 UNITS TABS TABLET    Take 800 Units by mouth daily.   CRANBERRY 475 MG CAPS    Take 1 capsule by mouth 2 (two) times daily.   FERROUS SULFATE 325 (65 FE) MG TABLET    Take 325 mg by mouth daily.     LISINOPRIL (PRINIVIL,ZESTRIL) 20 MG TABLET    Take 20 mg by mouth daily. Along with 5 mg tab for a total of 25 mg daily   LISINOPRIL (PRINIVIL,ZESTRIL) 5 MG TABLET    Take 5 mg by mouth daily. Along with 20 mg  for a total of 25 mg daily   LORAZEPAM (ATIVAN) 1 MG TABLET    1 by mouth daily for anxiety   MINERAL OIL LIQUID    Place into both ears once a week. 2 drops on wednesdays   SERTRALINE (ZOLOFT) 100 MG TABLET    Take 100 mg by mouth daily. Along with 50 mg tab for a total of 150 mg daily for depression   SERTRALINE (ZOLOFT) 50 MG TABLET    Take 50 mg by mouth daily. Along with 100 mg daily for a total of 150 mg daily for Depression   SIMVASTATIN (ZOCOR) 10 MG TABLET    Take 10 mg by mouth at bedtime. For hyperlipidemia   TRAMADOL (ULTRAM) 50 MG TABLET    Take 50 mg by mouth 2 (two) times daily.  Modified Medications   No medications on file  Discontinued Medications   OMEPRAZOLE (PRILOSEC) 20 MG CAPSULE    Take 20 mg by mouth daily.       Physical Exam:   Filed Vitals:   02/02/14 0908  BP: 128/72  Pulse: 78  Temp: 97.9 F (36.6 C)  TempSrc: Oral  Resp: 18  Weight: 123 lb (55.792 kg)  SpO2: 95%     Physical Exam  HENT:  Head: Normocephalic and atraumatic.  Mouth/Throat: Oropharynx is clear and moist. No oropharyngeal exudate.  Poor dentition, dry mouth   Eyes: Conjunctivae, EOM and lids are normal. Pupils are equal, round, and reactive to light. Right eye exhibits no discharge. Left eye exhibits no discharge.  Cardiovascular: Normal rate, regular rhythm and normal heart sounds.   Pulmonary/Chest: Effort normal and breath sounds normal. No respiratory distress.  Abdominal: Soft. Bowel sounds are normal. She exhibits no distension. There is no tenderness.  Musculoskeletal: She exhibits no edema and no tenderness.  Neurological: She is alert.  Skin: Skin is warm and dry.      Labs reviewed: WBC 10.0     4.0-10.5 K/uL SLN   RBC 4.10     3.87-5.11 MIL/uL SLN   Hemoglobin 11.5   L 12.0-15.0 g/dL SLN   Hematocrit 33.9   L 36.0-46.0 % SLN   MCV 82.7     78.0-100.0 fL SLN   MCH 28.0     26.0-34.0 pg SLN   MCHC 33.9     30.0-36.0 g/dL SLN   RDW 14.4     11.5-15.5 % SLN     Platelet Count 293     150-400 K/uL SLN   Granulocyte % 81   H 43-77 % SLN   Absolute Gran 8.1   H 1.7-7.7 K/uL SLN   Lymph % 8   L 12-46 % SLN   Absolute Lymph 0.8     0.7-4.0 K/uL SLN   Mono % 9     3-12 % SLN   Absolute Mono 0.9     0.1-1.0 K/uL SLN   Eos % 2     0-5 % SLN   Absolute Eos 0.2     0.0-0.7 K/uL SLN   Baso % 0     0-1 % SLN   Absolute Baso 0.0     0.0-0.1 K/uL SLN   Smear Review Criteria for review not met  SLN   Basic Metabolic Panel    Result: 11/27/2013 11:37 AM   ( Status: F )       Sodium 136     135-145 mEq/L SLN   Potassium 4.2     3.5-5.3 mEq/L SLN   Chloride 104     96-112 mEq/L SLN   CO2 21     19-32 mEq/L SLN   Glucose 84     70-99 mg/dL SLN   BUN 29   H 6-23 mg/dL SLN   Creatinine 1.01     0.50-1.10 mg/dL SLN   Calcium 9.1     8.4-10.5 mg/dL SLN  Assessment/Plan  1. Dementia in conditions classified elsewhere with behavioral disturbance -advanced dementia, staff reports some cognitive decline -conts supportive care   2. Essential hypertension, benign Patient is stable; continue current regimen. Will monitor and make changes as necessary.  3. Anxiety -stable at this time  4. Anemia, unspecified anemia type -conts on iron, will follow hgb  5. Dry mouth Will add biotene mouth wash to mouth care routine twice daily   

## 2014-02-27 ENCOUNTER — Non-Acute Institutional Stay (SKILLED_NURSING_FACILITY): Payer: Medicare Other | Admitting: Internal Medicine

## 2014-02-27 DIAGNOSIS — F02818 Dementia in other diseases classified elsewhere, unspecified severity, with other behavioral disturbance: Secondary | ICD-10-CM

## 2014-02-27 DIAGNOSIS — F0281 Dementia in other diseases classified elsewhere with behavioral disturbance: Secondary | ICD-10-CM

## 2014-02-27 NOTE — Progress Notes (Signed)
Patient ID: Tracy Bowers, female   DOB: [birth year], 80 y.o.   MRN: [id number]

## 2014-03-05 NOTE — Progress Notes (Addendum)
Patient ID: Tracy Bowers, female   DOB: 11-07-1924, 80 y.o.   MRN: [id number]               PROGRESS NOTE  DATE:  02/27/2014    FACILITY: [facility]    LEVEL OF CARE:   SNF   Acute Visit   CHIEF COMPLAINT:  Follow up behavioral issues associated with dementia.     HISTORY OF PRESENT ILLNESS:  This is a patient who came from an assisted living, [facility] on [address].    She has severe dementia with behavioral disturbances.  She is a DNR and has a [employer] form on the chart which suggests limited additional interventions.    She has had difficulties, apparently, with behaviors for several weeks now.  I think she actually had them when she came into the building.  She was seen by Psychiatry and I am not sure how aware they were of these behaviors.  However, it has been brought to my attention today.    I watched her ADLs being done.  The patient is restless, combative, tries to punch with her right fist.  More problematically than that, she looks really scared.    PHYSICAL EXAMINATION:   GENERAL APPEARANCE:  She does not appear to be in any distress.   CHEST/RESPIRATORY:  Clear air entry bilaterally.   CARDIOVASCULAR:  CARDIAC:   Heart sounds are normal.  She appears to be euvolemic.   GASTROINTESTINAL:  ABDOMEN:   Soft, as far as I can tell.   GENITOURINARY:  BLADDER:   No suprapubic or costovertebral angle tenderness.    ASSESSMENT/PLAN:  Dementia with behavioral disturbances.  Start her on Depakote Sprinkles 250 b.i.d.  There is no indication for neuroleptics here.  Since most of this appears to be with ADL care, I really do not feel that she needs a p.r.n.  The Depakote will take some time to have positive effects.  In discussion with her attendant today, it would appear that these are on most days of the week but not every day.

## 2014-04-10 ENCOUNTER — Non-Acute Institutional Stay (SKILLED_NURSING_FACILITY): Payer: PRIVATE HEALTH INSURANCE | Admitting: Internal Medicine

## 2014-04-10 DIAGNOSIS — E785 Hyperlipidemia, unspecified: Secondary | ICD-10-CM

## 2014-04-10 DIAGNOSIS — F0391 Unspecified dementia with behavioral disturbance: Secondary | ICD-10-CM

## 2014-04-10 DIAGNOSIS — F03918 Unspecified dementia, unspecified severity, with other behavioral disturbance: Secondary | ICD-10-CM

## 2014-04-10 DIAGNOSIS — I10 Essential (primary) hypertension: Secondary | ICD-10-CM

## 2014-04-18 NOTE — Progress Notes (Addendum)
Patient ID: Tracy RainsMarjorie Kelliher, female   DOB: 04-12-1925, 80 y.o.   MRN: [id number]              PROGRESS NOTE  DATE:  04/10/2014        [facility]    LEVEL OF CARE:   SNF   Routine Visit   CHIEF COMPLAINT:  Review of general medical issues/transfer to [facility].    HISTORY OF PRESENT ILLNESS:  This is an 80 year-old woman who was transferred from [facility] [address], which is an assisted living.  She has severe dementia with behavioral disturbances.  She came here, I believe, at the end of May.    Through her initial stay here, she became very agitated, restless and screaming.  She was seen by the in-house psychiatry service.  This required Depakote Sprinkles which were increased to 250 mg b.i.d.  She is now considerably quieter except for, I think, actual care activities where she can become agitated.    CURRENT MEDICATIONS:  Medication list is reviewed.     ASA 81 q.d.    Ferrous sulfate 325 daily.    Vitamin D 400, 2 tablets/800 IU daily.    Depakote 250 b.i.d.    Lisinopril 25 q.d.    Zoloft 150 q.d.    Ultram 50 mg b.i.d.    Zocor 10 q.d.    Ativan 1 tablet daily at 9 p.m., 1 mg.    REVIEW OF SYSTEMS:  Not really possible.    PHYSICAL EXAMINATION:   VITAL SIGNS:   TEMPERATURE:  Afebrile.   PULSE:  8_.   RESPIRATIONS:  16.   WEIGHT:  106 pounds.   CHEST/RESPIRATORY:  Clear air entry bilaterally.   CARDIOVASCULAR:  CARDIAC:   Heart sounds are normal.   ARTERIAL:  She has no significant evidence of PAD.   EDEMA/VARICOSITIES:  No edema.   PSYCHIATRIC:   MENTAL STATUS:   As long as you talk to her, she responds well.  Pleasant.  I think her dementia is severe.  The only behavioral issue she seems to have is with care.    ASSESSMENT/PLAN:  Dementia with behavioral disturbances.  I think this is adequately controlled.  When I was in to see her a month ago, she was combative with basic attempts at ADLs such as being aggressive.  I am not really  sure where we are with this.    History of hypertension.  This seems to be adequately managed.    Hyperlipidemia.  On Zocor.  I think the statin can be discontinued in the face of her advanced dementia.  History of depression.  She is on Zoloft.  I think we should continue this for the time being.  She follows with the in-house psychiatry service.

## 2014-05-08 ENCOUNTER — Other Ambulatory Visit: Payer: Self-pay | Admitting: *Deleted

## 2014-05-08 MED ORDER — TRAMADOL HCL 50 MG PO TABS: ORAL_TABLET | ORAL | Status: AC

## 2014-05-08 NOTE — Telephone Encounter (Signed)
[facility] 

## 2014-05-22 ENCOUNTER — Other Ambulatory Visit: Payer: Self-pay | Admitting: *Deleted

## 2014-05-22 MED ORDER — LORAZEPAM 1 MG PO TABS: ORAL_TABLET | ORAL | Status: DC

## 2014-05-22 NOTE — Telephone Encounter (Signed)
[facility] 

## 2014-06-11 ENCOUNTER — Other Ambulatory Visit: Payer: Self-pay | Admitting: *Deleted

## 2014-06-11 MED ORDER — ALPRAZOLAM 0.5 MG PO TABS: ORAL_TABLET | ORAL | Status: AC

## 2014-06-11 NOTE — Telephone Encounter (Signed)
[facility] 

## 2014-06-19 ENCOUNTER — Non-Acute Institutional Stay (SKILLED_NURSING_FACILITY): Payer: PRIVATE HEALTH INSURANCE | Admitting: Internal Medicine

## 2014-06-19 DIAGNOSIS — F0391 Unspecified dementia with behavioral disturbance: Secondary | ICD-10-CM

## 2014-06-19 DIAGNOSIS — F03918 Unspecified dementia, unspecified severity, with other behavioral disturbance: Secondary | ICD-10-CM

## 2014-06-19 DIAGNOSIS — D649 Anemia, unspecified: Secondary | ICD-10-CM

## 2014-06-25 NOTE — Progress Notes (Addendum)
Patient ID: Tracy Bowers, female   DOB: November 22, 1924, 81 y.o.   MRN: [id number]              PROGRESS NOTE  DATE:  06/19/2014       FACILITY: [facility]      LEVEL OF CARE:   SNF   Routine Visit   CHIEF COMPLAINT:  Review of general medical issues/[facility] visit.    HISTORY OF PRESENT ILLNESS:  This is an 81 year-old woman who was initially transferred here from [facility] assisted living on [address].  She has dementia with behavioral disturbances.  She came here at the end of May 2015.    Throughout her stay here, she is easily agitated, especially with care.  She is followed by the in-house Psychiatry service and seems actually better.  When I talked to the caregivers, they state there is still a lot of difficulty with one-on-one care.  She, according to the [facility] records, scored zero on her Folstein mini-mental status.    She is also felt to have coexistent depression.    Past Medical History  Diagnosis Date  . Dementia   . Arthritis   . Hypertension   . Anemia   . GERD (gastroesophageal reflux disease)   . Anxiety   . Coronary artery disease    No past surgical history on file.  SOCIAL HISTORY:                ADVANCED DIRECTIVES:  The patient is a DNR.    CURRENT MEDICATIONS:    Medication list is reviewed.     Aspirin 81 q.d.    Ferrous sulfate 325 daily.    Vitamin D 400 IU, 2 tablets daily.    Lisinopril 25 mg daily.    Ultram 50 mg b.i.d.    Zoloft 100 mg daily for depression.      Depakote Sprinkles 250 b.i.d.      LABORATORY DATA:  Recent lab work on 05/25/2014:    White count 10.1, hemoglobin 9.4.    Comprehensive metabolic panel essentially normal other than an albumin of 3.2.  BUN was 24 and a creatinine of 0.9.     PHYSICAL EXAMINATION:   VITAL SIGNS:   TEMPERATURE:  97.9.   PULSE:  74.   RESPIRATIONS:  18.   BLOOD PRESSURE:  128/68.   WEIGHT:  112 pounds, which is actually increased from last month.   CHEST/RESPIRATORY:  Clear air  entry bilaterally.   CARDIOVASCULAR:  CARDIAC:  She appears to be euvolemic.  Heart sounds are normal.  There are no murmurs.    ASSESSMENT/PLAN:                 Dementia with behavioral disturbances.  By my eye, this looks better although there are still problems.  She is followed by Psychiatry.    Major depression.  Zoloft was recently increased.  Again, followed by Psychiatry.    Anemia.  Her hemoglobin has fallen.  This was previously 11.5 on 11/27/2013.  This will need to be followed.  I see follow-up is ordered.  I do not think she would be a candidate for any investigations as to cause beyond what can be done here.     CPT CODE: 40981

## 2014-07-17 ENCOUNTER — Non-Acute Institutional Stay (SKILLED_NURSING_FACILITY): Payer: Medicare Other | Admitting: Internal Medicine

## 2014-07-17 DIAGNOSIS — F03918 Unspecified dementia, unspecified severity, with other behavioral disturbance: Secondary | ICD-10-CM

## 2014-07-17 DIAGNOSIS — D649 Anemia, unspecified: Secondary | ICD-10-CM

## 2014-07-17 DIAGNOSIS — F0391 Unspecified dementia with behavioral disturbance: Secondary | ICD-10-CM

## 2014-07-20 NOTE — Progress Notes (Addendum)
Patient ID: Tracy RainsMarjorie Bowers, female   DOB: August 29, 1924, 81 y.o.   MRN: [id number]                PROGRESS NOTE  DATE:  07/17/2014            [facility]                             LEVEL OF CARE:   SNF   Routine Visit   CHIEF COMPLAINT:   Review of general medical issues/[facility] visit.     HISTORY OF PRESENT ILLNESS:  This is an 81 year-old woman who initially came here from an assisted living.  She has dementia with behavioral disturbances and came here in May 2015.    Throughout her stay here, she is easily agitated, especially with care.  She is followed with the Psychiatry service and seems better.    There have been no recent issues.    PAST MEDICAL HISTORY/PROBLEM LIST:                       Dementia.    Arthritis.    Hypertension.     Anemia.    Gastroesophageal reflux disease.    Coronary artery disease.    CURRENT MEDICATIONS:  Medication list is reviewed.                 Aspirin 81 q.d.    Ferrous sulfate 325 daily.    Vitamin D 4000 U, 2 tablets/8000 U daily.    Lisinopril 25 q.d.     Zoloft 100 q.d.    Cranberry 1 capsule twice daily.     Depakote 250 twice daily.     Ultram 50 b.i.d.      Zocor 10 q.d.     LABORATORY DATA:   Recent lab work on 05/25/2014 showed:    CBC:  White count 10.1, hemoglobin 9.4, platelet count of 223.  This is normochromic, normocytic, and down from 11.5 in June.    Comprehensive metabolic panel:  Normal other than an albumin of 3.2.    PHYSICAL EXAMINATION:   CHEST/RESPIRATORY:  Clear air entry bilaterally.   CARDIOVASCULAR:   CARDIAC:  Heart sounds are normal.  There are no murmurs.   GASTROINTESTINAL:   ABDOMEN:  Soft.  There are no masses.    ASSESSMENT/PLAN:                                         Dementia with behavioral disturbances.  This appears to be some better.  She also has depression, followed by Psychiatry.    Anemia.  Her hemoglobin has fallen.  This will need to be followed.      CPT CODE: [id number]

## 2014-08-14 ENCOUNTER — Non-Acute Institutional Stay (SKILLED_NURSING_FACILITY): Payer: Medicare Other | Admitting: Internal Medicine

## 2014-08-14 DIAGNOSIS — F03918 Unspecified dementia, unspecified severity, with other behavioral disturbance: Secondary | ICD-10-CM

## 2014-08-14 DIAGNOSIS — F0391 Unspecified dementia with behavioral disturbance: Secondary | ICD-10-CM | POA: Diagnosis not present

## 2014-08-14 DIAGNOSIS — D649 Anemia, unspecified: Secondary | ICD-10-CM

## 2014-08-17 NOTE — Progress Notes (Addendum)
Patient ID: Tracy RainsMarjorie Sturdy, female   DOB: January 09, 1925, 81 y.o.   MRN: [id number]               PROGRESS NOTE  DATE:  08/14/2014                [facility]                        LEVEL OF CARE:   SNF   Routine Visit   CHIEF COMPLAINT:  Review of general medical issues/[facility] visit.          HISTORY OF PRESENT ILLNESS:  This is an 81 year-old woman who came here from an assisted living in May 2015.     She has not had any acute medical issues.  However, she does have behavioral issues either related to her dementia and/or coexistent depression.  She is followed by Psychiatry in the house.    She generally has a normochromic, normocytic anemia which was 9 on 08/08/2014.   This has fluctuated considerably.  She remains on iron supplements.  It is not felt that the patient is a candidate for an aggressive work-up of this.   No guaiac stools have been ordered.    CURRENT MEDICATIONS:  Medication list is reviewed.                    ASA 81 q.d.    Ferrous sulfate 325 q.d.      Vitamin D 100 U daily.    Prinivil 25 q.d.       Zoloft 50 q.d.        Cranberry 475 twice daily.         Biotene mouth rinse twice daily.    Depakote 250 twice daily.     Ultram 50 b.i.d.       Zocor 10 q.d.       Ativan 0.5, 1 tablet every night.    Xanax 0.5 every night at bedtime.     LABORATORY DATA:  Last lab work on 08/08/2014:    CBC showed a white count of 4.6, hemoglobin of 9, and a platelet count of 294.  Indices are persistently normochromic, normocytic.  On 08/01/2014, her hemoglobin was 9.9.  On 11/27/2013, this was 11.5.    SOCIAL HISTORY:   ADVANCED DIRECTIVES:  The patient is a DNR.     PHYSICAL EXAMINATION:  This is almost impossible to do with any meaning.  The patient is too restless, pushes hands away, etc.    GENERAL APPEARANCE:  Nevertheless, she does not appear to be in any distress.    CHEST/RESPIRATORY:  Exam is clear.     CARDIOVASCULAR:  CARDIAC:   Heart  sounds are normal.  She does not appear to be dehydrated.   GASTROINTESTINAL:  ABDOMEN:   Soft.  There are no masses.      ASSESSMENT/PLAN:              Dementia with behavioral disturbances.  I see no depression today.  However, this might fluctuate.    Anemia.  Her hemoglobin fluctuates.  I agree with the tone of the nurse practitioner's notes.  I do not think this patient is a candidate for any GI consultations or evaluations.

## 2014-08-29 ENCOUNTER — Other Ambulatory Visit: Payer: Self-pay | Admitting: *Deleted

## 2014-08-29 MED ORDER — LORAZEPAM 1 MG PO TABS: ORAL_TABLET | ORAL | Status: DC

## 2014-09-04 ENCOUNTER — Non-Acute Institutional Stay (SKILLED_NURSING_FACILITY): Payer: Medicare Other | Admitting: Internal Medicine

## 2014-09-04 DIAGNOSIS — D649 Anemia, unspecified: Secondary | ICD-10-CM

## 2014-09-04 DIAGNOSIS — F0391 Unspecified dementia with behavioral disturbance: Secondary | ICD-10-CM | POA: Diagnosis not present

## 2014-09-04 DIAGNOSIS — F03918 Unspecified dementia, unspecified severity, with other behavioral disturbance: Secondary | ICD-10-CM

## 2014-09-07 ENCOUNTER — Other Ambulatory Visit: Payer: Self-pay | Admitting: *Deleted

## 2014-09-07 MED ORDER — LORAZEPAM 1 MG PO TABS: ORAL_TABLET | ORAL | Status: AC

## 2014-09-07 NOTE — Telephone Encounter (Signed)
[facility] # [phone] Fax: [phone] 

## 2014-09-07 NOTE — Progress Notes (Addendum)
Patient ID: Tracy Bowers, female   DOB: [birth year], 81 y.o.   MRN: [id number]                PROGRESS NOTE  DATE:  09/04/2014            FAC[facility]: [facility]                         LEVEL OF CARE:   SNF   Routine Visit                                    CHIEF COMPLAINT:  Review of general medical issues/[facility] visit.      HISTORY OF PRESENT ILLNESS:  This is an 81 year-old woman who came here from an assisted living in May 2015.    She does have behavioral issues related to her dementia.  Follows with Psychiatry in-house.     She is also felt to have depression and anxiety.  She has had a gradual dose reduction of her SSRI (Zoloft) and this has been tapering to off.  Otherwise, she has been stable.    The only medical issue is that she has had a recent discovery of anemia which is in the 10-11 range.  She remains on iron.  She is not felt to be a candidate for colonoscopy.  Therefore, we have not really been guaiacing her.    The nurses do not report any acute issues.  Variable mental status with anxiety at times.    SOCIAL HISTORY:           ADVANCED DIRECTIVES:  The patient is a DNR.    CURRENT MEDICATIONS:  Medication list is reviewed.                       ASA 81 q.d.                         Ferrous sulfate 325 daily.      Vitamin D 400, 2 tablets/800 IU by mouth daily.     Zestril 25 q.d.        Zoloft, currently being tapered at 25 q.d.         Depakote 250 twice daily.       Ultram 50 b.i.d.           Zocor 10 q.d.                       Ativan 0.5 q.h.s.          Xanax 0.5 q.h.s.         LABORATORY DATA:                        On 08/08/2014:    Hemoglobin was 9, normochromic, normocytic.   This is down from 9.9 on 08/01/2014 and  9.4 on 05/25/2014.      PHYSICAL EXAMINATION:   GENERAL APPEARANCE:  The patient remains very restless with attempts at exam, pushing my hands away.   CHEST/RESPIRATORY:  As far as I can tell, her exam is clear.      CARDIOVASCULAR:   CARDIAC:  Heart sounds are normal.  She does not appear to be dehydrated.      ASSESSMENT/PLAN:  Dementia with behavioral disturbances.  I think her depression is felt to be better and she is gradually tapering off the Zoloft.    Anemia.  Her hemoglobin fluctuates.  I do not think this patient is a candidate for any GI consultations or resultant evaluations.       CPT CODE: [id number]

## 2014-09-27 ENCOUNTER — Other Ambulatory Visit: Payer: Self-pay | Admitting: *Deleted

## 2014-09-27 ENCOUNTER — Other Ambulatory Visit: Payer: Self-pay

## 2014-09-27 MED ORDER — AMBULATORY NON FORMULARY MEDICATION: Status: AC

## 2014-09-27 MED ORDER — LORAZEPAM 0.5 MG PO TABS: ORAL_TABLET | ORAL | Status: AC

## 2014-09-27 NOTE — Telephone Encounter (Signed)
[facility] 

## 2014-09-27 NOTE — Telephone Encounter (Signed)
Rx faxed to [facility] @ [phone], phone number [phone]  

## 2014-10-21 DEATH — deceased
